# Patient Record
Sex: Female | Born: 1970 | Race: White | Hispanic: No | Marital: Married | Smoking: Never smoker
Health system: Southern US, Community
[De-identification: ages and names within clinical notes are randomized; demographics above are authoritative.]

## PROBLEM LIST (undated history)

## (undated) DIAGNOSIS — I2699 Other pulmonary embolism without acute cor pulmonale: Secondary | ICD-10-CM

## (undated) DIAGNOSIS — O009 Unspecified ectopic pregnancy without intrauterine pregnancy: Secondary | ICD-10-CM

## (undated) DIAGNOSIS — K5792 Diverticulitis of intestine, part unspecified, without perforation or abscess without bleeding: Secondary | ICD-10-CM

## (undated) DIAGNOSIS — O139 Gestational [pregnancy-induced] hypertension without significant proteinuria, unspecified trimester: Secondary | ICD-10-CM

## (undated) HISTORY — PX: MENISCUS REPAIR: SHX5179

---

## 2006-03-10 ENCOUNTER — Encounter: Admission: RE | Admit: 2006-03-10 | Discharge: 2006-03-10 | Payer: Self-pay | Admitting: Orthopedic Surgery

## 2009-08-29 ENCOUNTER — Encounter: Admission: RE | Admit: 2009-08-29 | Discharge: 2009-08-29 | Payer: Self-pay | Admitting: Gastroenterology

## 2016-06-26 ENCOUNTER — Other Ambulatory Visit: Payer: Self-pay | Admitting: Internal Medicine

## 2016-06-26 ENCOUNTER — Ambulatory Visit
Admission: RE | Admit: 2016-06-26 | Discharge: 2016-06-26 | Disposition: A | Discharge status: Home / Self Care | Payer: BLUE CROSS/BLUE SHIELD | Source: Ambulatory Visit | Attending: Internal Medicine | Admitting: Internal Medicine

## 2016-06-26 DIAGNOSIS — M545 Low back pain: Secondary | ICD-10-CM

## 2016-11-12 DIAGNOSIS — Z1231 Encounter for screening mammogram for malignant neoplasm of breast: Secondary | ICD-10-CM | POA: Diagnosis not present

## 2017-02-03 DIAGNOSIS — S5001XA Contusion of right elbow, initial encounter: Secondary | ICD-10-CM | POA: Diagnosis not present

## 2017-06-30 DIAGNOSIS — Z0000 Encounter for general adult medical examination without abnormal findings: Secondary | ICD-10-CM | POA: Diagnosis not present

## 2017-06-30 DIAGNOSIS — M25551 Pain in right hip: Secondary | ICD-10-CM | POA: Diagnosis not present

## 2017-10-31 DIAGNOSIS — Z3049 Encounter for surveillance of other contraceptives: Secondary | ICD-10-CM | POA: Diagnosis not present

## 2017-10-31 DIAGNOSIS — Z0000 Encounter for general adult medical examination without abnormal findings: Secondary | ICD-10-CM | POA: Diagnosis not present

## 2017-11-13 DIAGNOSIS — Z1231 Encounter for screening mammogram for malignant neoplasm of breast: Secondary | ICD-10-CM | POA: Diagnosis not present

## 2017-12-01 DIAGNOSIS — M25511 Pain in right shoulder: Secondary | ICD-10-CM | POA: Diagnosis not present

## 2017-12-03 ENCOUNTER — Other Ambulatory Visit: Payer: Self-pay | Admitting: Sports Medicine

## 2017-12-03 DIAGNOSIS — M25511 Pain in right shoulder: Secondary | ICD-10-CM

## 2017-12-29 DIAGNOSIS — H5711 Ocular pain, right eye: Secondary | ICD-10-CM | POA: Diagnosis not present

## 2017-12-29 DIAGNOSIS — S0500XA Injury of conjunctiva and corneal abrasion without foreign body, unspecified eye, initial encounter: Secondary | ICD-10-CM | POA: Diagnosis not present

## 2018-01-03 DIAGNOSIS — H1033 Unspecified acute conjunctivitis, bilateral: Secondary | ICD-10-CM | POA: Diagnosis not present

## 2018-03-25 ENCOUNTER — Other Ambulatory Visit: Payer: Self-pay

## 2018-03-25 ENCOUNTER — Emergency Department (HOSPITAL_BASED_OUTPATIENT_CLINIC_OR_DEPARTMENT_OTHER): Payer: BLUE CROSS/BLUE SHIELD

## 2018-03-25 ENCOUNTER — Encounter (HOSPITAL_COMMUNITY): Payer: Self-pay

## 2018-03-25 ENCOUNTER — Emergency Department (HOSPITAL_COMMUNITY)
Admission: EM | Admit: 2018-03-25 | Discharge: 2018-03-25 | Disposition: A | Discharge status: Home / Self Care | Payer: BLUE CROSS/BLUE SHIELD | Attending: Emergency Medicine | Admitting: Emergency Medicine

## 2018-03-25 DIAGNOSIS — R609 Edema, unspecified: Secondary | ICD-10-CM

## 2018-03-25 DIAGNOSIS — M79609 Pain in unspecified limb: Secondary | ICD-10-CM | POA: Diagnosis not present

## 2018-03-25 DIAGNOSIS — M79662 Pain in left lower leg: Secondary | ICD-10-CM | POA: Diagnosis not present

## 2018-03-25 HISTORY — DX: Diverticulitis of intestine, part unspecified, without perforation or abscess without bleeding: K57.92

## 2018-03-25 HISTORY — DX: Unspecified ectopic pregnancy without intrauterine pregnancy: O00.90

## 2018-03-25 HISTORY — DX: Gestational (pregnancy-induced) hypertension without significant proteinuria, unspecified trimester: O13.9

## 2018-03-25 NOTE — ED Notes (Signed)
Patient able to ambulate independently  

## 2018-03-25 NOTE — Discharge Instructions (Addendum)
Please read attached information. If you experience any new or worsening signs or symptoms please return to the emergency room for evaluation. Please follow-up with your primary care provider or specialist as discussed.  °

## 2018-03-25 NOTE — Progress Notes (Signed)
*  Preliminary Results* Left lower extremity venous duplex completed. Left lower extremity is negative for deep vein thrombosis. There is no evidence of left Baker's cyst.  03/25/2018 12:15 PM  Billyjack Trompeter Clare Gandyiddle

## 2018-03-25 NOTE — ED Triage Notes (Signed)
Pt endorses left calf pain/swelling x 1 week, [place], sent here for vas US by [facility]. VSS.

## 2018-03-25 NOTE — ED Provider Notes (Signed)
[facility] EMERGENCY DEPARTMENT Provider Note   CSN: [id number] Arrival date & time: 03/25/18  1110   History   Chief Complaint Chief Complaint  Patient presents with  . Leg Swelling    HPI Ahmoni Edge is a 47 y.o. female.  HPI   47 year old female presents stating with complaints of left calf pain. Patient notes that week and half ago she flew from [place] to here. She knows shortly after the flight she developed pain in her left calf. She notes some swelling. She denies any other complaints. No history of the same. No trauma. No chest pain or shortness of breath.  Past Medical History:  Diagnosis Date  . Diverticulitis   . Ectopic pregnancy   . Pregnancy-induced hypertension     There are no active problems to display for this patient.   Past Surgical History:  Procedure Laterality Date  . MENISCUS REPAIR       OB History   None      Home Medications    Prior to Admission medications   Not on File    Family History History reviewed. No pertinent family history.  Social History Social History   Tobacco Use  . Smoking status: Never Smoker  Substance Use Topics  . Alcohol use: Never    Frequency: Never  . Drug use: Never     Allergies   Patient has no known allergies.   Review of Systems Review of Systems  All other systems reviewed and are negative.  Physical Exam Updated Vital Signs BP 112/71   Pulse (!) 54   Temp 98.4 F (36.9 C) (Oral)   Resp 18   Ht 5\' 7"  (1.702 m)   Wt 63.5 kg (140 lb)   LMP 03/13/2018 (Exact Date)   SpO2 100%   BMI 21.93 kg/m   Physical Exam  Constitutional: She is oriented to person, place, and time. She appears well-developed and well-nourished.  HENT:  Head: Normocephalic and atraumatic.  Eyes: Pupils are equal, round, and reactive to light. Conjunctivae are normal. Right eye exhibits no discharge. Left eye exhibits no discharge. No scleral icterus.  Neck: Normal range of  motion. No JVD present. No tracheal deviation present.  Pulmonary/Chest: Effort normal. No stridor.  Musculoskeletal:  Left lower extremity with no swelling or edema, minor tenderness palpation of the left lateral gastroc, full active range of motion- tendon intact  Neurological: She is alert and oriented to person, place, and time. Coordination normal.  Psychiatric: She has a normal mood and affect. Her behavior is normal. Judgment and thought content normal.  Nursing note and vitals reviewed.    ED Treatments / Results  Labs (all labs ordered are listed, but only abnormal results are displayed) Labs Reviewed - No data to display  EKG None  Radiology No results found.  Procedures Procedures (including critical care time)  Medications Ordered in ED Medications - No data to display   Initial Impression / Assessment and Plan / ED Course  I have reviewed the triage vital signs and the nursing notes.  Pertinent labs & imaging results that were available during my care of the patient were reviewed by me and considered in my medical decision making (see chart for details).     Labs:   Imaging:  Consults:  Therapeutics:  Discharge Meds:   Assessment/Plan: 47 year old female presents today with complaints of leg pain. Patient with a negative DVT study here. Likely muscular in nature, discharge with outpatient follow-up. Patient verbalized  understanding and agreement to today's plan had no further questions concerns.      Final Clinical Impressions(s) / ED Diagnoses   Final diagnoses:  Pain of left calf    ED Discharge Orders    None       Rosalio LoudHedges, Makenley Shimp, PA-C 03/25/18 1357    Melene PlanFloyd, Dan, DO 03/25/18 1405

## 2018-07-14 DIAGNOSIS — Z0000 Encounter for general adult medical examination without abnormal findings: Secondary | ICD-10-CM | POA: Diagnosis not present

## 2018-07-14 DIAGNOSIS — Z136 Encounter for screening for cardiovascular disorders: Secondary | ICD-10-CM | POA: Diagnosis not present

## 2018-11-02 DIAGNOSIS — Z0000 Encounter for general adult medical examination without abnormal findings: Secondary | ICD-10-CM | POA: Diagnosis not present

## 2018-11-02 DIAGNOSIS — Z3049 Encounter for surveillance of other contraceptives: Secondary | ICD-10-CM | POA: Diagnosis not present

## 2018-11-04 ENCOUNTER — Other Ambulatory Visit: Payer: Self-pay | Admitting: Internal Medicine

## 2018-11-04 ENCOUNTER — Emergency Department (HOSPITAL_COMMUNITY): Payer: BLUE CROSS/BLUE SHIELD

## 2018-11-04 ENCOUNTER — Other Ambulatory Visit: Payer: Self-pay

## 2018-11-04 ENCOUNTER — Emergency Department (HOSPITAL_COMMUNITY)
Admission: EM | Admit: 2018-11-04 | Discharge: 2018-11-04 | Disposition: A | Discharge status: Home / Self Care | Payer: BLUE CROSS/BLUE SHIELD | Attending: Emergency Medicine | Admitting: Emergency Medicine

## 2018-11-04 ENCOUNTER — Encounter (HOSPITAL_COMMUNITY): Payer: Self-pay | Admitting: *Deleted

## 2018-11-04 ENCOUNTER — Ambulatory Visit
Admission: RE | Admit: 2018-11-04 | Discharge: 2018-11-04 | Disposition: A | Discharge status: Home / Self Care | Payer: BLUE CROSS/BLUE SHIELD | Source: Ambulatory Visit | Attending: Internal Medicine | Admitting: Internal Medicine

## 2018-11-04 DIAGNOSIS — R079 Chest pain, unspecified: Secondary | ICD-10-CM | POA: Diagnosis not present

## 2018-11-04 DIAGNOSIS — R0781 Pleurodynia: Secondary | ICD-10-CM | POA: Diagnosis not present

## 2018-11-04 DIAGNOSIS — R0789 Other chest pain: Secondary | ICD-10-CM | POA: Diagnosis not present

## 2018-11-04 DIAGNOSIS — R0602 Shortness of breath: Secondary | ICD-10-CM | POA: Diagnosis not present

## 2018-11-04 DIAGNOSIS — Z79899 Other long term (current) drug therapy: Secondary | ICD-10-CM | POA: Diagnosis not present

## 2018-11-04 DIAGNOSIS — R7989 Other specified abnormal findings of blood chemistry: Secondary | ICD-10-CM | POA: Diagnosis not present

## 2018-11-04 DIAGNOSIS — M25511 Pain in right shoulder: Secondary | ICD-10-CM | POA: Diagnosis not present

## 2018-11-04 LAB — BASIC METABOLIC PANEL
ANION GAP: 7 (ref 5–15)
BUN: 9 mg/dL (ref 6–20)
CHLORIDE: 107 mmol/L (ref 98–111)
CO2: 23 mmol/L (ref 22–32)
Calcium: 9.1 mg/dL (ref 8.9–10.3)
Creatinine, Ser: 0.9 mg/dL (ref 0.44–1.00)
GFR calc non Af Amer: >60 mL/min (ref >60)
GLUCOSE: 152 mg/dL — AB (ref 70–99)
Potassium: 3.8 mmol/L (ref 3.5–5.1)
Sodium: 137 mmol/L (ref 135–145)

## 2018-11-04 LAB — CBC
HCT: 38.1 % (ref 36.0–46.0)
Hemoglobin: 12.2 g/dL (ref 12.0–15.0)
MCH: 31.1 pg (ref 26.0–34.0)
MCHC: 32 g/dL (ref 30.0–36.0)
MCV: 97.2 fL (ref 80.0–100.0)
NRBC: 0 % (ref 0.0–0.2)
PLATELETS: 290 10*3/uL (ref 150–400)
RBC: 3.92 MIL/uL (ref 3.87–5.11)
RDW: 12 % (ref 11.5–15.5)
WBC: 10.9 10*3/uL — ABNORMAL HIGH (ref 4.0–10.5)

## 2018-11-04 LAB — I-STAT TROPONIN, ED: Troponin i, poc: 0 ng/mL (ref 0.00–0.08)

## 2018-11-04 LAB — I-STAT BETA HCG BLOOD, ED (MC, WL, AP ONLY)

## 2018-11-04 MED ORDER — KETOROLAC TROMETHAMINE 15 MG/ML IJ SOLN
15.0000 mg | Freq: Once | INTRAMUSCULAR | Status: AC
  Administered 2018-11-04: 15 mg via INTRAVENOUS
  Filled 2018-11-04: qty 1

## 2018-11-04 MED ORDER — SODIUM CHLORIDE 0.9% FLUSH
3.0000 mL | Freq: Once | INTRAVENOUS | Status: AC
  Administered 2018-11-04: 3 mL via INTRAVENOUS

## 2018-11-04 MED ORDER — IOPAMIDOL (ISOVUE-370) INJECTION 76%
INTRAVENOUS | Status: AC
  Administered 2018-11-04: 75 mL via INTRAVENOUS
  Filled 2018-11-04: qty 100

## 2018-11-04 NOTE — ED Notes (Signed)
Pt to CT

## 2018-11-04 NOTE — ED Provider Notes (Signed)
[facility] EMERGENCY DEPARTMENT Provider Note   CSN: [id number] Arrival date & time: 11/04/18  1156    History   Chief Complaint Chief Complaint  Patient presents with  . Chest Pain  . Abnormal Lab    HPI Dawn Arnold is a 48 y.o. female.     48 yo F with a chief complaint of right-sided chest wall pain.  This is worse with deep breathing movement or palpation or twisting.  Going on for the past couple days.  She saw her family doctor today in the office and was prescribed muscle relaxer and called back when her d-dimer came back elevated and told to come to the emergency department.  The patient denies hemoptysis denies unilateral lower extremity edema denies recent surgery or immobilization or travel.  She denies estrogen use.  Denies history of PE or DVT.  She denies relation with food denies cough congestion or fever.  This had gotten worse when she lays back flat and so she saw her family doctor today.  The history is provided by the patient.  Chest Pain  Pain location:  R chest Pain quality: sharp and shooting   Pain radiates to:  Does not radiate Pain severity:  Moderate Onset quality:  Gradual Duration:  2 days Timing:  Constant Progression:  Worsening Chronicity:  New Context: breathing   Relieved by:  Nothing Worsened by:  Nothing Ineffective treatments:  None tried Associated symptoms: no dizziness, no fever, no headache, no nausea, no palpitations, no shortness of breath and no vomiting   Abnormal Lab    Past Medical History:  Diagnosis Date  . Diverticulitis   . Ectopic pregnancy   . Pregnancy-induced hypertension     There are no active problems to display for this patient.   Past Surgical History:  Procedure Laterality Date  . MENISCUS REPAIR       OB History   No obstetric history on file.      Home Medications    Prior to Admission medications   Medication Sig Start Date End Date Taking? Authorizing Provider    etonogestrel-ethinyl estradiol (NUVARING) 0.12-0.015 MG/24HR vaginal ring Take 0.12 mg by mouth once. 11/02/18  Yes [provider]  ibuprofen (ADVIL) 200 MG tablet Take 200 mg by mouth every 6 (six) hours as needed.   Yes [provider]  polycarbophil (FIBERCON) 625 MG tablet Take 625 mg by mouth daily. gummie   Yes [provider]    Family History History reviewed. No pertinent family history.  Social History Social History   Tobacco Use  . Smoking status: Never Smoker  Substance Use Topics  . Alcohol use: Never    Frequency: Never  . Drug use: Never     Allergies   Patient has no known allergies.   Review of Systems Review of Systems  Constitutional: Negative for chills and fever.  HENT: Negative for congestion and rhinorrhea.   Eyes: Negative for redness and visual disturbance.  Respiratory: Negative for shortness of breath and wheezing.   Cardiovascular: Positive for chest pain. Negative for palpitations.  Gastrointestinal: Negative for nausea and vomiting.  Genitourinary: Negative for dysuria and urgency.  Musculoskeletal: Positive for arthralgias and myalgias.  Skin: Negative for pallor and wound.  Neurological: Negative for dizziness and headaches.     Physical Exam Updated Vital Signs BP 112/77   Pulse 66   Temp 98.2 F (36.8 C) (Oral)   Resp 17   Ht 5\' 7"  (1.702 m)  Wt 65.8 kg   LMP 10/22/2018 (Exact Date)   SpO2 100%   BMI 22.71 kg/m   Physical Exam Vitals signs and nursing note reviewed.  Constitutional:      General: She is not in acute distress.    Appearance: She is well-developed. She is not diaphoretic.  HENT:     Head: Normocephalic and atraumatic.  Eyes:     Pupils: Pupils are equal, round, and reactive to light.  Neck:     Musculoskeletal: Normal range of motion and neck supple.  Cardiovascular:     Rate and Rhythm: Normal rate and regular rhythm.     Heart sounds: No murmur. No friction rub. No  gallop.   Pulmonary:     Effort: Pulmonary effort is normal.     Breath sounds: No wheezing or rales.  Chest:     Comments: No appreciable pain on palpation but the patient is able to reproduce her times by twisting her torso. Abdominal:     General: There is no distension.     Palpations: Abdomen is soft.     Tenderness: There is no abdominal tenderness.     Comments: Benign abdominal exam.  Negative Murphy's, no pain in the right upper quadrant  Musculoskeletal:        General: No tenderness.  Skin:    General: Skin is warm and dry.  Neurological:     Mental Status: She is alert and oriented to person, place, and time.  Psychiatric:        Behavior: Behavior normal.      ED Treatments / Results  Labs (all labs ordered are listed, but only abnormal results are displayed) Labs Reviewed  BASIC METABOLIC PANEL - Abnormal; Notable for the following components:      Result Value   Glucose, Bld 152 (*)    All other components within normal limits  CBC - Abnormal; Notable for the following components:   WBC 10.9 (*)    All other components within normal limits  I-STAT TROPONIN, ED  I-STAT BETA HCG BLOOD, ED ([facility], [facility], [facility] ONLY)    EKG EKG Interpretation  Date/Time:  Wednesday November 04 2018 12:01:20 EST Ventricular Rate:  85 PR Interval:  130 QRS Duration: 84 QT Interval:  356 QTC Calculation: 423 R Axis:   84 Text Interpretation:  Normal sinus rhythm with sinus arrhythmia Normal ECG No significant change since last tracing Confirmed by Melene PlanFloyd, Hillel Card [phone]) on 11/04/2018 12:26:06 PM Also confirmed by Melene PlanFloyd, Macaila Tahir [phone]), editor Barbette Hairassel, Kerry [phone]([id number])  on 11/04/2018 12:48:14 PM   Radiology Dg Chest 2 View  Result Date: 11/04/2018 CLINICAL DATA:  Chest pain. EXAM: CHEST - 2 VIEW COMPARISON:  None. FINDINGS: The heart size and mediastinal contours are within normal limits. Both lungs are clear. No pneumothorax or pleural effusion is noted. The visualized skeletal structures are  unremarkable. IMPRESSION: No active cardiopulmonary disease. Electronically Signed   By: Lupita RaiderJames  Green Jr, M.D.   On: 11/04/2018 10:25   Ct Angio Chest Pe W And/or Wo Contrast  Result Date: 11/04/2018 CLINICAL DATA:  Pt c/o severe pain that started in RIGHT shoulder, and has moved down entire RIGHT side of chest; Pt extremely SOB when lying down; onset of symptoms was yesterday, no known injury the EXAM: CT ANGIOGRAPHY CHEST WITH CONTRAST TECHNIQUE: Multidetector CT imaging of the chest was performed using the standard protocol during bolus administration of intravenous contrast. Multiplanar CT image reconstructions and MIPs were obtained to evaluate the vascular anatomy.  CONTRAST:  ISOVUE-370 IOPAMIDOL (ISOVUE-370) INJECTION 76% COMPARISON:  10/22/2015 FINDINGS: Cardiovascular: Heart size normal. No effusion. Satisfactory opacification of pulmonary arteries noted, and there is no evidence of pulmonary emboli. Mediastinum/Nodes: Fair contrast opacification of the thoracic aorta. No suggestion of dissection, aneurysm, or stenosis. 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 Visualized proximal abdominal aorta unremarkable. No significant atheromatous plaque. Lungs/Pleura: Trace right pleural effusion. No pneumothorax. Focal subsegmental atelectasis or consolidation, lateral basal segment right lower lobe. Lungs otherwise clear. Upper Abdomen: No acute findings Musculoskeletal: No chest wall abnormality. No acute or significant osseous findings. Review of the MIP images confirms the above findings. IMPRESSION: 1.  Negative for acute PE or thoracic aortic dissection. 2. Subsegmental airspace disease, lateral basal segment right lower lobe, with small effusion. Electronically Signed   By: Corlis Leak M.D.   On: 11/04/2018 14:14    Procedures Procedures (including critical care time)  Medications Ordered in ED Medications  sodium chloride flush (NS) 0.9 % injection 3 mL  (3 mLs Intravenous Given 11/04/18 1245)  iopamidol (ISOVUE-370) 76 % injection (75 mLs Intravenous Contrast Given 11/04/18 1405)     Initial Impression / Assessment and Plan / ED Course  I have reviewed the triage vital signs and the nursing notes.  Pertinent labs & imaging results that were available during my care of the patient were reviewed by me and considered in my medical decision making (see chart for details).        48 yo F with a chief complaint of pleuritic right-sided chest pain.  This been going on for the past couple days.  Sounds muscular by history.  She had a d-dimer performed in the office that was negative.  No noted risk factors for me for PE.  She is PERC negative.  ECG here is unremarkable troponin is negative.  CT scan of the chest was negative for acute pulmonary embolism or pneumonia or other concerning pathology.  I discussed the results with the patient.  Will treat as its muscular.  PCP follow-up.  2:30 PM:  I have discussed the diagnosis/risks/treatment options with the patient and family and believe the pt to be eligible for discharge home to follow-up with PCP. We also discussed returning to the ED immediately if new or worsening sx occur. We discussed the sx which are most concerning (e.g., sudden worsening pain, fever, inability to tolerate by mouth) that necessitate immediate return. Medications administered to the patient during their visit and any new prescriptions provided to the patient are listed below.  Medications given during this visit Medications  sodium chloride flush (NS) 0.9 % injection 3 mL (3 mLs Intravenous Given 11/04/18 1245)  iopamidol (ISOVUE-370) 76 % injection (75 mLs Intravenous Contrast Given 11/04/18 1405)     The patient appears reasonably screen and/or stabilized for discharge and I doubt any other medical condition or other [facility] requiring further screening, evaluation, or treatment in the ED at this time prior to discharge.     Final Clinical Impressions(s) / ED Diagnoses   Final diagnoses:  Chest wall pain    ED Discharge Orders    None       Melene Plan, DO 11/04/18 1431

## 2018-11-04 NOTE — Discharge Instructions (Signed)
Take 4 over the counter ibuprofen tablets 3 times a day or 2 over-the-counter naproxen tablets twice a day for pain. Also take tylenol 1000mg(2 extra strength) four times a day.    

## 2018-11-04 NOTE — ED Triage Notes (Signed)
Pt sent over due to elevated d-dimer by her PCP office, they checked this due to chest pain since Monday, pt reports pain is worse with taking a deep breath or laying flat, no distress noted

## 2018-11-16 DIAGNOSIS — Z1231 Encounter for screening mammogram for malignant neoplasm of breast: Secondary | ICD-10-CM | POA: Diagnosis not present

## 2018-12-25 DIAGNOSIS — R51 Headache: Secondary | ICD-10-CM | POA: Diagnosis not present

## 2018-12-29 DIAGNOSIS — G44009 Cluster headache syndrome, unspecified, not intractable: Secondary | ICD-10-CM | POA: Diagnosis not present

## 2019-10-13 DIAGNOSIS — Z20828 Contact with and (suspected) exposure to other viral communicable diseases: Secondary | ICD-10-CM | POA: Diagnosis not present

## 2019-10-16 DIAGNOSIS — Z20828 Contact with and (suspected) exposure to other viral communicable diseases: Secondary | ICD-10-CM | POA: Diagnosis not present

## 2019-10-20 DIAGNOSIS — Z20828 Contact with and (suspected) exposure to other viral communicable diseases: Secondary | ICD-10-CM | POA: Diagnosis not present

## 2019-10-22 DIAGNOSIS — Z20828 Contact with and (suspected) exposure to other viral communicable diseases: Secondary | ICD-10-CM | POA: Diagnosis not present

## 2019-10-26 DIAGNOSIS — U071 COVID-19: Secondary | ICD-10-CM | POA: Diagnosis not present

## 2019-11-11 DIAGNOSIS — D225 Melanocytic nevi of trunk: Secondary | ICD-10-CM | POA: Diagnosis not present

## 2019-11-11 DIAGNOSIS — D1801 Hemangioma of skin and subcutaneous tissue: Secondary | ICD-10-CM | POA: Diagnosis not present

## 2019-11-11 DIAGNOSIS — I781 Nevus, non-neoplastic: Secondary | ICD-10-CM | POA: Diagnosis not present

## 2019-11-11 DIAGNOSIS — D2239 Melanocytic nevi of other parts of face: Secondary | ICD-10-CM | POA: Diagnosis not present

## 2019-11-12 DIAGNOSIS — R0602 Shortness of breath: Secondary | ICD-10-CM | POA: Diagnosis not present

## 2019-11-12 DIAGNOSIS — U071 COVID-19: Secondary | ICD-10-CM | POA: Diagnosis not present

## 2019-11-12 DIAGNOSIS — R000 Tachycardia, unspecified: Secondary | ICD-10-CM | POA: Diagnosis not present

## 2019-11-17 DIAGNOSIS — Z3049 Encounter for surveillance of other contraceptives: Secondary | ICD-10-CM | POA: Diagnosis not present

## 2019-11-17 DIAGNOSIS — Z1231 Encounter for screening mammogram for malignant neoplasm of breast: Secondary | ICD-10-CM | POA: Diagnosis not present

## 2019-11-17 DIAGNOSIS — Z01419 Encounter for gynecological examination (general) (routine) without abnormal findings: Secondary | ICD-10-CM | POA: Diagnosis not present

## 2019-12-02 DIAGNOSIS — R928 Other abnormal and inconclusive findings on diagnostic imaging of breast: Secondary | ICD-10-CM | POA: Diagnosis not present

## 2019-12-02 DIAGNOSIS — N6321 Unspecified lump in the left breast, upper outer quadrant: Secondary | ICD-10-CM | POA: Diagnosis not present

## 2019-12-10 DIAGNOSIS — D529 Folate deficiency anemia, unspecified: Secondary | ICD-10-CM | POA: Diagnosis not present

## 2020-08-05 DIAGNOSIS — Z20828 Contact with and (suspected) exposure to other viral communicable diseases: Secondary | ICD-10-CM | POA: Diagnosis not present

## 2020-08-05 DIAGNOSIS — J029 Acute pharyngitis, unspecified: Secondary | ICD-10-CM | POA: Diagnosis not present

## 2020-08-05 DIAGNOSIS — J069 Acute upper respiratory infection, unspecified: Secondary | ICD-10-CM | POA: Diagnosis not present

## 2020-10-07 DIAGNOSIS — Z20822 Contact with and (suspected) exposure to covid-19: Secondary | ICD-10-CM | POA: Diagnosis not present

## 2020-10-08 DIAGNOSIS — Z20828 Contact with and (suspected) exposure to other viral communicable diseases: Secondary | ICD-10-CM | POA: Diagnosis not present

## 2020-10-08 DIAGNOSIS — R509 Fever, unspecified: Secondary | ICD-10-CM | POA: Diagnosis not present

## 2020-10-08 DIAGNOSIS — J209 Acute bronchitis, unspecified: Secondary | ICD-10-CM | POA: Diagnosis not present

## 2020-10-08 DIAGNOSIS — J029 Acute pharyngitis, unspecified: Secondary | ICD-10-CM | POA: Diagnosis not present

## 2020-10-12 DIAGNOSIS — R0981 Nasal congestion: Secondary | ICD-10-CM | POA: Diagnosis not present

## 2021-02-01 IMAGING — DX DG CHEST 2V
2 series · 2 of 2 positions shown · non-contrast
Comparison: None.

CLINICAL DATA: Chest pain.

EXAM:
CHEST - 2 VIEW

[dg chest 2 view (1 of 2)]
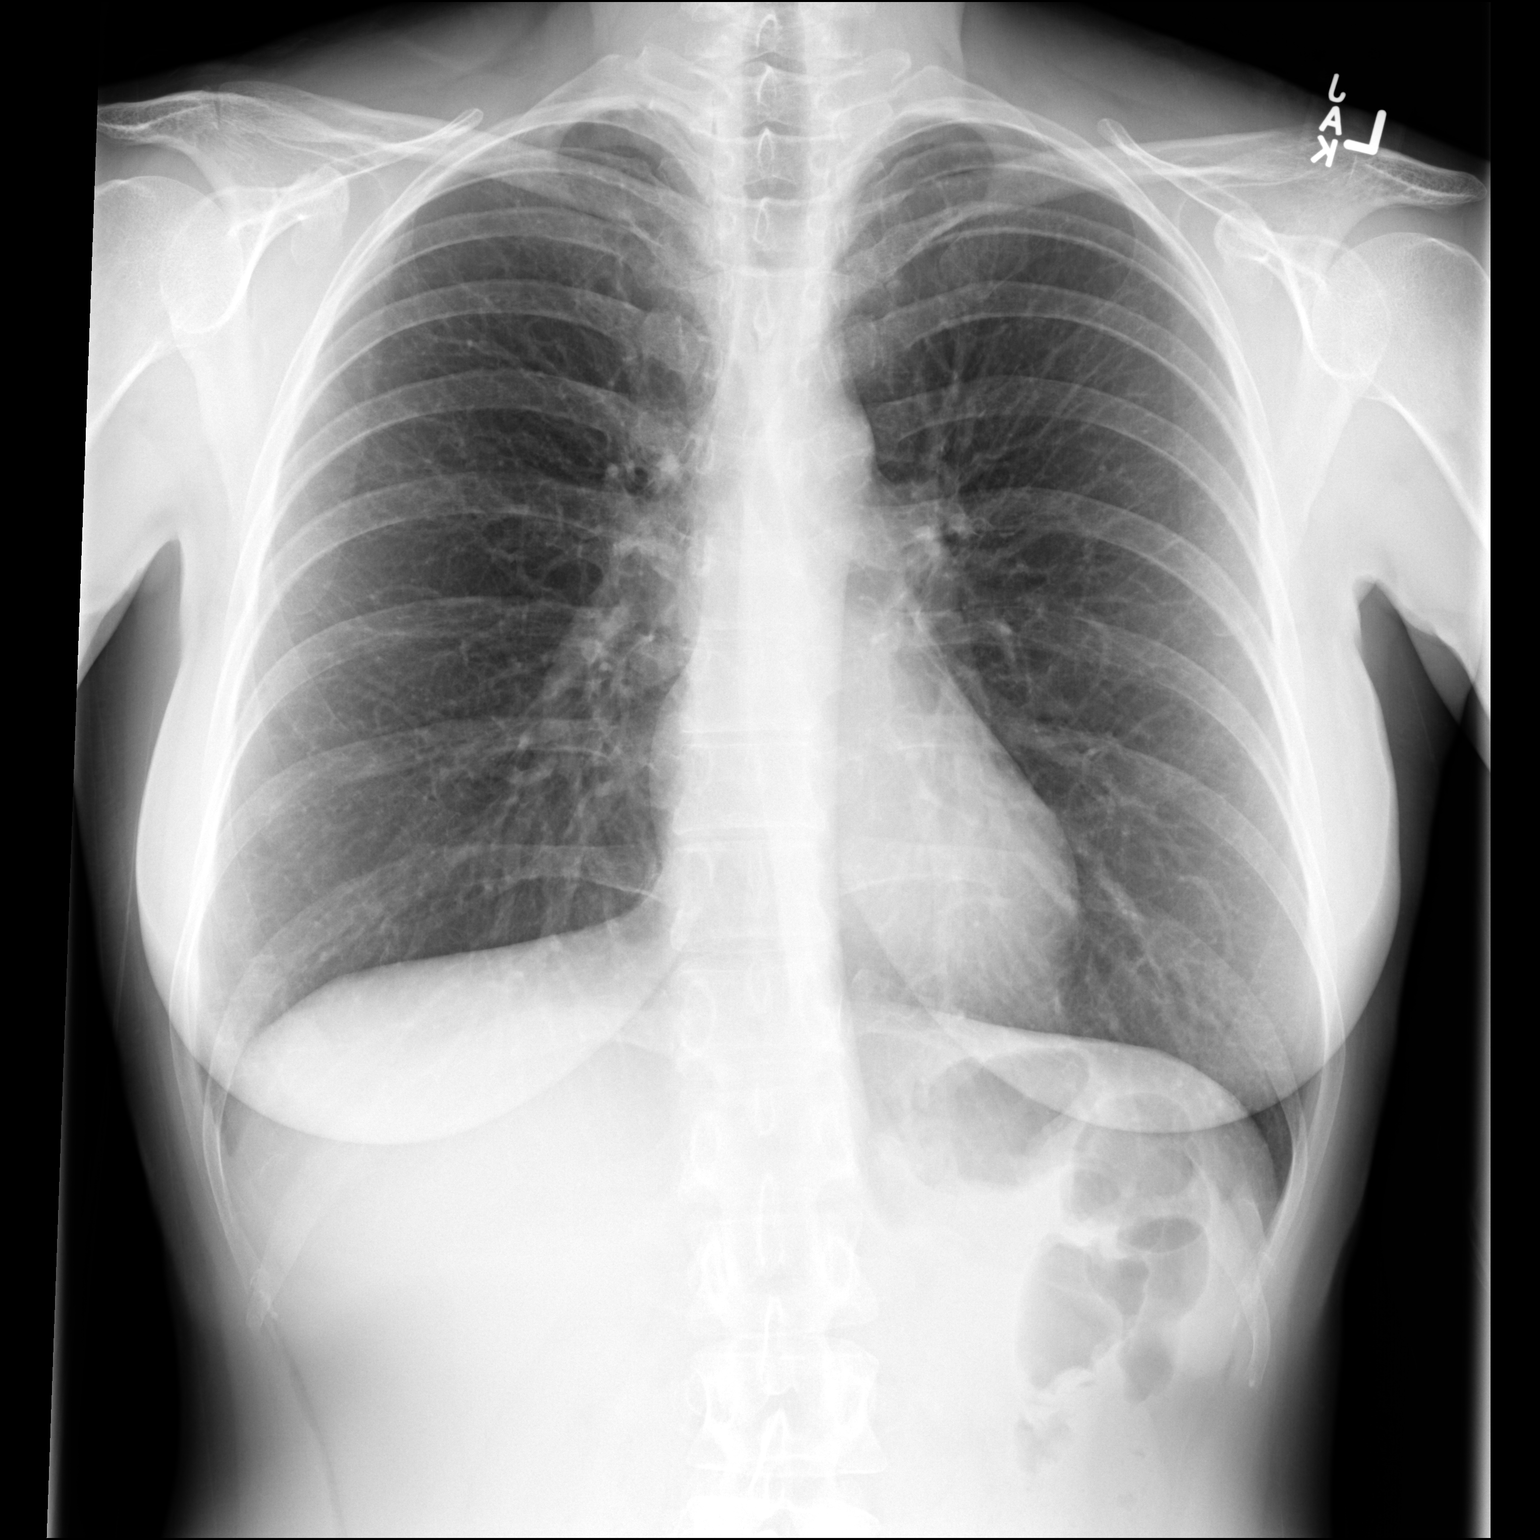

[dg chest 2 view (2 of 2)]
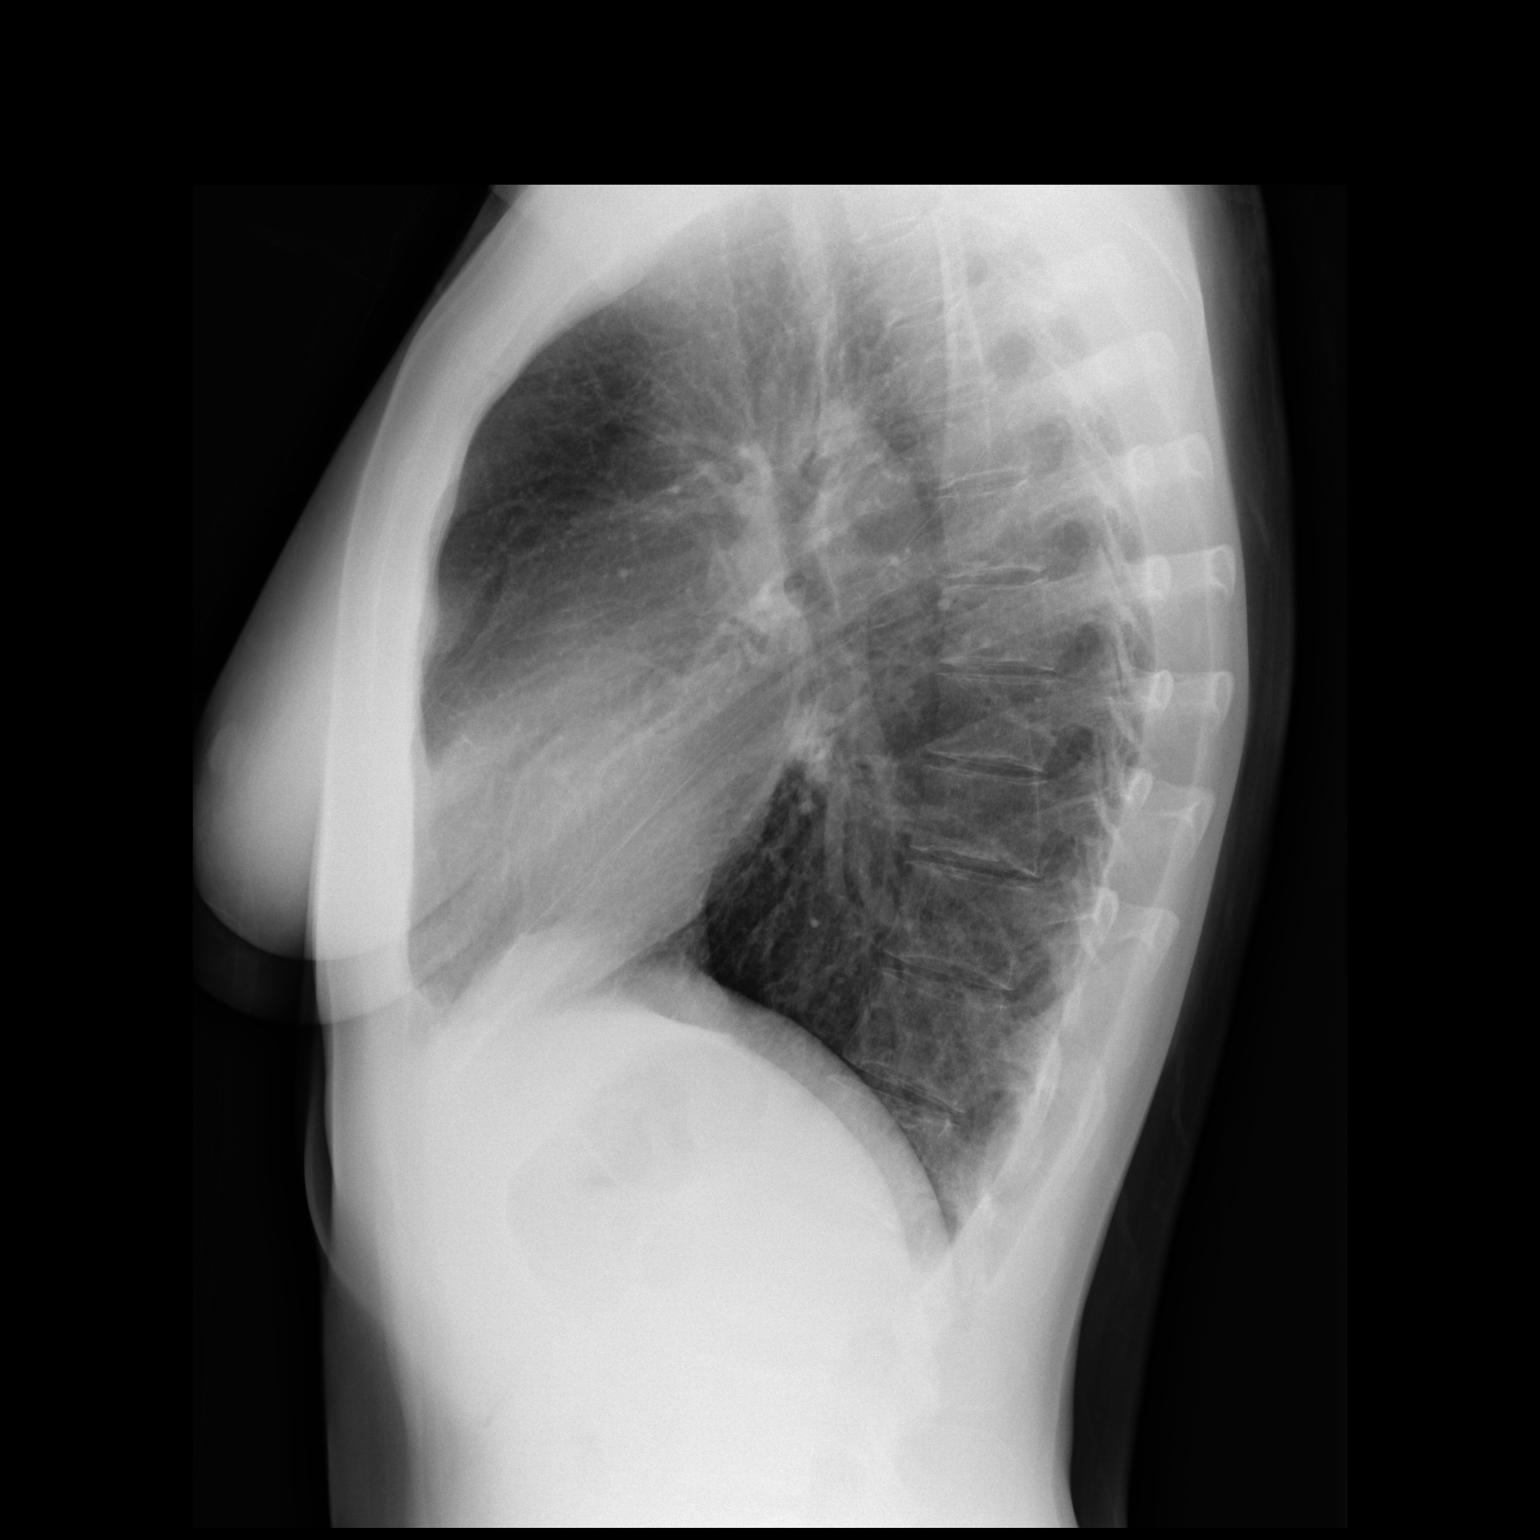

[2 of 2 positions shown; findings below may reference images not displayed]

FINDINGS: The heart size and mediastinal contours are within normal limits.
Both lungs are clear. No pneumothorax or pleural effusion is noted.
The visualized skeletal structures are unremarkable.
IMPRESSION: No active cardiopulmonary disease.

## 2021-02-14 DIAGNOSIS — Z1151 Encounter for screening for human papillomavirus (HPV): Secondary | ICD-10-CM | POA: Diagnosis not present

## 2021-02-14 DIAGNOSIS — Z1231 Encounter for screening mammogram for malignant neoplasm of breast: Secondary | ICD-10-CM | POA: Diagnosis not present

## 2021-02-14 DIAGNOSIS — Z01411 Encounter for gynecological examination (general) (routine) with abnormal findings: Secondary | ICD-10-CM | POA: Diagnosis not present

## 2021-02-14 DIAGNOSIS — Z01419 Encounter for gynecological examination (general) (routine) without abnormal findings: Secondary | ICD-10-CM | POA: Diagnosis not present

## 2021-02-14 DIAGNOSIS — N952 Postmenopausal atrophic vaginitis: Secondary | ICD-10-CM | POA: Diagnosis not present

## 2021-02-14 DIAGNOSIS — Z8759 Personal history of other complications of pregnancy, childbirth and the puerperium: Secondary | ICD-10-CM | POA: Diagnosis not present

## 2021-08-28 ENCOUNTER — Emergency Department (HOSPITAL_COMMUNITY)
Admission: EM | Admit: 2021-08-28 | Discharge: 2021-08-28 | Disposition: A | Discharge status: Home / Self Care | Payer: BC Managed Care – PPO | Attending: Emergency Medicine | Admitting: Emergency Medicine

## 2021-08-28 ENCOUNTER — Emergency Department (HOSPITAL_COMMUNITY): Payer: BC Managed Care – PPO

## 2021-08-28 DIAGNOSIS — W19XXXA Unspecified fall, initial encounter: Secondary | ICD-10-CM

## 2021-08-28 DIAGNOSIS — Y9301 Activity, walking, marching and hiking: Secondary | ICD-10-CM | POA: Diagnosis not present

## 2021-08-28 DIAGNOSIS — S3993XA Unspecified injury of pelvis, initial encounter: Secondary | ICD-10-CM | POA: Diagnosis not present

## 2021-08-28 DIAGNOSIS — Y92009 Unspecified place in unspecified non-institutional (private) residence as the place of occurrence of the external cause: Secondary | ICD-10-CM | POA: Insufficient documentation

## 2021-08-28 DIAGNOSIS — W109XXA Fall (on) (from) unspecified stairs and steps, initial encounter: Secondary | ICD-10-CM | POA: Insufficient documentation

## 2021-08-28 DIAGNOSIS — S300XXA Contusion of lower back and pelvis, initial encounter: Secondary | ICD-10-CM | POA: Insufficient documentation

## 2021-08-28 NOTE — Discharge Instructions (Signed)
There is no indication for fracture at this time.  Use ice on sore spots 3 times a day for 2 days after that heat.  For pain take ibuprofen or acetaminophen.  To increase your activity as tolerated.  It may take 3 to 5 days to resume your normal exercise pattern.  Follow-up with your doctor for problems.

## 2021-08-28 NOTE — ED Provider Notes (Signed)
[facility] EMERGENCY DEPARTMENT Provider Note   CSN: [id number] Arrival date & time: 08/28/21  0865     History Chief Complaint  Patient presents with   Fall   Hip Pain    Dawn Arnold is a 50 y.o. female.  HPI She presents for evaluation of fall which occurred at 530 at home.  She was able ambulate afterwards.  She describes the fall as misstepped while walking down stairs carrying things, and falling onto her buttocks.  She was able to ambulate after a few minutes.  She went back to bed and waited for her husband to bring her here.  She came by private vehicle.  She complains of pain in the left buttock region.  She denies other injuries.    Past Medical History:  Diagnosis Date   Diverticulitis    Ectopic pregnancy    Pregnancy-induced hypertension     There are no problems to display for this patient.   Past Surgical History:  Procedure Laterality Date   MENISCUS REPAIR       OB History   No obstetric history on file.     No family history on file.  Social History   Tobacco Use   Smoking status: Never  Substance Use Topics   Alcohol use: Never   Drug use: Never    Home Medications Prior to Admission medications   Medication Sig Start Date End Date Taking? Authorizing Provider  etonogestrel-ethinyl estradiol (NUVARING) 0.12-0.015 MG/24HR vaginal ring Take 0.12 mg by mouth once. 11/02/18   [provider]  ibuprofen (ADVIL) 200 MG tablet Take 200 mg by mouth every 6 (six) hours as needed.    [provider]  polycarbophil (FIBERCON) 625 MG tablet Take 625 mg by mouth daily. gummie    [provider]    Allergies    Patient has no known allergies.  Review of Systems   Review of Systems  All other systems reviewed and are negative.  Physical Exam Updated Vital Signs BP 140/82    Pulse 79    Temp 99.1 F (37.3 C) (Oral)    Resp 16    LMP 08/16/2021    SpO2 100%   Physical Exam Vitals and nursing  note reviewed.  Constitutional:      Appearance: She is well-developed. She is not ill-appearing.  HENT:     Head: Normocephalic and atraumatic.     Right Ear: External ear normal.     Left Ear: External ear normal.  Eyes:     Conjunctiva/sclera: Conjunctivae normal.     Pupils: Pupils are equal, round, and reactive to light.  Neck:     Trachea: Phonation normal.  Cardiovascular:     Rate and Rhythm: Normal rate.  Pulmonary:     Effort: Pulmonary effort is normal. No respiratory distress.     Breath sounds: No stridor.  Abdominal:     General: There is no distension.  Musculoskeletal:        General: Normal range of motion.     Cervical back: Normal range of motion and neck supple.     Comments: Mild tenderness, without swelling in the left mid lateral upper buttock.  No palpable tenderness of the lumbar or sacral areas.  Normal range of motion of left hip and knee.  Skin:    General: Skin is warm and dry.  Neurological:     Mental Status: She is alert and oriented to person, place, and time.  Cranial Nerves: No cranial nerve deficit.     Sensory: No sensory deficit.     Motor: No abnormal muscle tone.     Coordination: Coordination normal.  Psychiatric:        Mood and Affect: Mood normal.        Behavior: Behavior normal.        Thought Content: Thought content normal.        Judgment: Judgment normal.    ED Results / Procedures / Treatments   Labs (all labs ordered are listed, but only abnormal results are displayed) Labs Reviewed - No data to display  EKG None  Radiology DG Sacrum/Coccyx  Result Date: 08/28/2021 CLINICAL DATA:  Larey Seat with pain in the sacrum and coccyx. EXAM: SACRUM AND COCCYX - 2+ VIEW COMPARISON:  None. FINDINGS: There is no evidence of fracture or other focal bone lesions. IMPRESSION: Normal radiographs. Electronically Signed   By: Paulina Fusi M.D.   On: 08/28/2021 08:07    Procedures Procedures   Medications Ordered in ED Medications  - No data to display  ED Course  I have reviewed the triage vital signs and the nursing notes.  Pertinent labs & imaging results that were available during my care of the patient were reviewed by me and considered in my medical decision making (see chart for details).    MDM Rules/Calculators/A&P                            Patient Vitals for the past 24 hrs:  BP Temp Temp src Pulse Resp SpO2  08/28/21 0945 140/82 -- -- 79 16 100 %  08/28/21 0915 114/80 -- -- (!) 55 (!) 26 100 %  08/28/21 0900 113/78 -- -- (!) 56 18 100 %  08/28/21 0734 128/72 99.1 F (37.3 C) Oral 75 16 100 %    9:51 AM Reevaluation with update and discussion. After initial assessment and treatment, an updated evaluation reveals no further complaints, findings discussed and questions answered. Mancel Bale   Medical Decision Making:  This patient is presenting for evaluation of contusion left buttock, which does require a range of treatment options, and is a complaint that involves a moderate risk of morbidity and mortality. The differential diagnoses include contusion versus fracture. I decided to review old records, and in summary presenting with injuries from a mechanical fall.  I did not require additional historical information from anyone.   Radiologic Tests Ordered, included sacrum and coccyx x-rays.  I independently Visualized: Radiographic images, which show no fracture    Critical Interventions-clinical evaluation, radiography  After These Interventions, the Patient was reevaluated and was found stable for discharge.  Patient with contusion, no fracture, no reason for further ED evaluation or hospitalization at this time  CRITICAL CARE-no Performed by: Mancel Bale  Nursing Notes Reviewed/ Care Coordinated Applicable Imaging Reviewed Interpretation of Laboratory Data incorporated into ED treatment  The patient appears reasonably screened and/or stabilized for discharge and I doubt any other  medical condition or other [facility] requiring further screening, evaluation, or treatment in the ED at this time prior to discharge.  Plan: Home Medications-continue usual and use OTC meds for pain; Home Treatments-cryotherapy, heat therapy; return here if the recommended treatment, does not improve the symptoms; Recommended follow up-PCP,     Final Clinical Impression(s) / ED Diagnoses Final diagnoses:  Fall  Fall, initial encounter  Contusion of pelvis, initial encounter    Rx / DC Orders  ED Discharge Orders     None        Mancel Bale, MD 08/28/21 [phone]

## 2021-08-28 NOTE — ED Triage Notes (Signed)
Pt. Stated, she slipped down stairs and hit her tail bone and left side , left hip area.

## 2023-01-10 ENCOUNTER — Encounter (HOSPITAL_BASED_OUTPATIENT_CLINIC_OR_DEPARTMENT_OTHER): Payer: Self-pay | Admitting: Urology

## 2023-01-10 ENCOUNTER — Emergency Department (HOSPITAL_BASED_OUTPATIENT_CLINIC_OR_DEPARTMENT_OTHER): Payer: BC Managed Care – PPO

## 2023-01-10 ENCOUNTER — Emergency Department (HOSPITAL_BASED_OUTPATIENT_CLINIC_OR_DEPARTMENT_OTHER)
Admission: EM | Admit: 2023-01-10 | Discharge: 2023-01-10 | Disposition: A | Discharge status: Home / Self Care | Payer: BC Managed Care – PPO | Attending: Emergency Medicine | Admitting: Emergency Medicine

## 2023-01-10 ENCOUNTER — Other Ambulatory Visit: Payer: Self-pay

## 2023-01-10 DIAGNOSIS — I2694 Multiple subsegmental pulmonary emboli without acute cor pulmonale: Secondary | ICD-10-CM | POA: Diagnosis not present

## 2023-01-10 DIAGNOSIS — I10 Essential (primary) hypertension: Secondary | ICD-10-CM | POA: Diagnosis not present

## 2023-01-10 DIAGNOSIS — R0789 Other chest pain: Secondary | ICD-10-CM | POA: Diagnosis present

## 2023-01-10 LAB — CBC
HCT: 37.3 % (ref 36.0–46.0)
Hemoglobin: 12.3 g/dL (ref 12.0–15.0)
MCH: 31.5 pg (ref 26.0–34.0)
MCHC: 33 g/dL (ref 30.0–36.0)
MCV: 95.6 fL (ref 80.0–100.0)
Platelets: 273 10*3/uL (ref 150–400)
RBC: 3.9 MIL/uL (ref 3.87–5.11)
RDW: 12.4 % (ref 11.5–15.5)
WBC: 11.3 10*3/uL — ABNORMAL HIGH (ref 4.0–10.5)
nRBC: 0 % (ref 0.0–0.2)

## 2023-01-10 LAB — BASIC METABOLIC PANEL
Anion gap: 7 (ref 5–15)
BUN: 24 mg/dL — ABNORMAL HIGH (ref 6–20)
CO2: 24 mmol/L (ref 22–32)
Calcium: 8.7 mg/dL — ABNORMAL LOW (ref 8.9–10.3)
Chloride: 104 mmol/L (ref 98–111)
Creatinine, Ser: 0.99 mg/dL (ref 0.44–1.00)
GFR, Estimated: >60 mL/min (ref >60)
Glucose, Bld: 105 mg/dL — ABNORMAL HIGH (ref 70–99)
Potassium: 4.4 mmol/L (ref 3.5–5.1)
Sodium: 135 mmol/L (ref 135–145)

## 2023-01-10 LAB — PREGNANCY, URINE: Preg Test, Ur: NEGATIVE

## 2023-01-10 LAB — TROPONIN I (HIGH SENSITIVITY)
Troponin I (High Sensitivity): 5 ng/L (ref <18)
Troponin I (High Sensitivity): 5 ng/L (ref <18)

## 2023-01-10 LAB — D-DIMER, QUANTITATIVE: D-Dimer, Quant: 2.76 ug/mL-FEU — ABNORMAL HIGH (ref 0.00–0.50)

## 2023-01-10 MED ORDER — APIXABAN 2.5 MG PO TABS
10.0000 mg | ORAL_TABLET | Freq: Two times a day (BID) | ORAL | Status: DC
  Administered 2023-01-10: 10 mg via ORAL
  Filled 2023-01-10: qty 4

## 2023-01-10 MED ORDER — ACETAMINOPHEN 325 MG PO TABS
1000.0000 mg | ORAL_TABLET | Freq: Three times a day (TID) | ORAL | 0 refills | Status: AC | PRN
Start: 2023-01-10 — End: ?

## 2023-01-10 MED ORDER — APIXABAN (ELIQUIS) VTE STARTER PACK (10MG AND 5MG): ORAL_TABLET | ORAL | 0 refills | Status: DC

## 2023-01-10 MED ORDER — IOHEXOL 350 MG/ML SOLN
75.0000 mL | Freq: Once | INTRAVENOUS | Status: AC | PRN
  Administered 2023-01-10: 75 mL via INTRAVENOUS

## 2023-01-10 NOTE — ED Notes (Signed)
Attempt at IV x 1 but unable to pass valve, some labs obtained, light green and lavender

## 2023-01-10 NOTE — ED Triage Notes (Signed)
Pt states has sharp pain in left side of lung/chest approx 2 hours after working out  Took ibuprofen for pain Coughed up a little blood, took xray at [facility] and saw something on left lobe and was told to come here  Received "Shot for pain" and it is helping  Denies any SOB

## 2023-01-10 NOTE — ED Provider Notes (Signed)
[facility] Provider Note   CSN: [id number] Arrival date & time: 01/10/23  1613     History {Add pertinent medical, surgical, social history, OB history to HPI:1} Chief Complaint  Patient presents with   Chest Pain    Dawn Arnold is a 52 y.o. female presented to the ED with acute onset of left-sided chest pain and bloody cough.  Patient reports that she had a routine workout this morning, was not having any pain at that time.  Approximately 2 hours later she had an acute onset of sharp pain in the left side of her chest, mostly localized around the left lower ribs.  She said the pain seems to be worse with laying down and better with standing upright.  She had the pain seemed to radiate into her left neck.  She took ibuprofen which significantly helped her pain.  She went to urgent care, where they gave her some Toradol, now her pain is extremely minimal.  But they were concerned because the patient reported that she had some coughing earlier and noted blood in her sputum.  She denies any ongoing issues with coughing the past several days, or fevers or chills.  Denies smoking history.  Denies any unilateral leg or calf pain or swelling, or history of DVT or PE, or any recent surgeries.  She does report that she has a NuvaRing  HPI     Home Medications Prior to Admission medications   Medication Sig Start Date End Date Taking? Authorizing Provider  etonogestrel-ethinyl estradiol (NUVARING) 0.12-0.015 MG/24HR vaginal ring Take 0.12 mg by mouth once. 11/02/18   [provider]  ibuprofen (ADVIL) 200 MG tablet Take 200 mg by mouth every 6 (six) hours as needed.    [provider]  polycarbophil (FIBERCON) 625 MG tablet Take 625 mg by mouth daily. gummie    [provider]      Allergies    Patient has no known allergies.    Review of Systems   Review of Systems  Physical Exam Updated Vital Signs BP (!) 161/106  (BP Location: Left Arm)   Pulse 88   Temp 98 F (36.7 C) (Oral)   Resp 18   Ht 5\' 7"  (1.702 m)   Wt 65.8 kg   LMP 12/16/2022   SpO2 99%   BMI 22.72 kg/m  Physical Exam Constitutional:      General: She is not in acute distress. HENT:     Head: Normocephalic and atraumatic.  Eyes:     Conjunctiva/sclera: Conjunctivae normal.     Pupils: Pupils are equal, round, and reactive to light.  Cardiovascular:     Rate and Rhythm: Normal rate and regular rhythm.  Pulmonary:     Effort: Pulmonary effort is normal. No respiratory distress.  Abdominal:     General: There is no distension.     Tenderness: There is no abdominal tenderness.  Skin:    General: Skin is warm and dry.  Neurological:     General: No focal deficit present.     Mental Status: She is alert. Mental status is at baseline.  Psychiatric:        Mood and Affect: Mood normal.        Behavior: Behavior normal.     ED Results / Procedures / Treatments   Labs (all labs ordered are listed, but only abnormal results are displayed) Labs Reviewed  BASIC METABOLIC PANEL  CBC  PREGNANCY, URINE  D-DIMER,  QUANTITATIVE  TROPONIN I (HIGH SENSITIVITY)    EKG None  Radiology No results found.  Procedures Procedures  {Document cardiac monitor, telemetry assessment procedure when appropriate:1}  Medications Ordered in ED Medications - No data to display  ED Course/ Medical Decision Making/ A&P   {   Click here for ABCD2, HEART and other calculatorsREFRESH Note before signing :1}                          Medical Decision Making Amount and/or Complexity of Data Reviewed Labs: ordered. Radiology: ordered.   This patient presents to the ED with concern for sharp left-sided chest pain, hemoptysis. This involves an extensive number of treatment options, and is a complaint that carries with it a high risk of complications and morbidity.  The differential diagnosis includes pulmonary embolism versus bronchitis versus  pneumonia versus pneumothorax versus other  I did receive phone report from the ED physician at the urgent care, who reported that the x-rays at that time were not showing any pneumothorax or any clear evident pneumonia.  I am not able to directly visualize his x-ray images.  But given the patient's clinical presentation, I have a high enough suspicion for potential pulmonary embolism, that I would proceed with CT imaging at this time, and avoid repeat x-ray imaging.  Thankfully she has minimal pain at this time.  She is mildly hypertensive but otherwise her vitals appear unremarkable.  She is not hypoxic.  Additional history obtained from [facility] physician by phone  I ordered and personally interpreted labs.  The pertinent results include:  ***  I ordered imaging studies including CT PE study I independently visualized and interpreted imaging which showed *** I agree with the radiologist interpretation  The patient was maintained on a cardiac monitor.  I personally viewed and interpreted the cardiac monitored which showed an underlying rhythm of: NSR  Per my interpretation the patient's ECG shows NSR with no acute ischemic findings, no evidence of pericarditis  I have reviewed the patients home medicines and have made adjustments as needed  Test Considered: ***  I requested consultation with the ***,  and discussed lab and imaging findings as well as pertinent plan - they recommend: ***  After the interventions noted above, I reevaluated the patient and found that they have: {resolved/improved/worsened:23923::"improved"}  Social Determinants of Health:***  Dispostion:  After consideration of the diagnostic results and the patients response to treatment, I feel that the patent would benefit from ***.   {Document critical care time when appropriate:1} {Document review of labs and clinical decision tools ie heart score, Chads2Vasc2 etc:1}  {Document your independent review of radiology  images, and any outside records:1} {Document your discussion with family members, caretakers, and with consultants:1} {Document social determinants of health affecting pt's care:1} {Document your decision making why or why not admission, treatments were needed:1} Final Clinical Impression(s) / ED Diagnoses Final diagnoses:  None    Rx / DC Orders ED Discharge Orders     None

## 2023-01-10 NOTE — Discharge Instructions (Addendum)
You were diagnosed with pulmonary embolisms today.  These are small blood clots that were found in your lungs, specifically concentrated in the left lung.  A small part of your lung on the left side also appears to have infarcted, which means the tissue may have died from lack of blood flow.  This can be a cause pain on the left side of your chest.  You were started on a blood thinner medicine called Eliquis, she will take as prescribed until you follow-up with the specialist.  You were given a referral to see a hematologist who is a blood specialist.  They will help determine further workup that may be needed for your blood clots.  Please be aware that while you are taking a blood thinner called Eliquis, he should avoid other blood thinning medications.  This most commonly includes "NSAID" (nonsteroidal anti-inflammatory drugs) which are common over-the-counter.  These would include ibuprofen, Advil, Motrin, Aleve, and aspirin.  You can continue taking Tylenol as needed for pain.  Taking a blood thinning medicine also puts you at higher risk of bleeding after trauma, as well as spontaneous or internal bleeding. If you experience any type of significant trauma or injuries, particularly head traumas, you should go to the emergency department for evaluation.  If you notice bloody stools, or begin to feel dizzy or lightheaded, stop taking your Eliquis and contact your doctor's office.  You can continue exercising normally.  Please avoid contact sports or activities that may put you at risk of head trauma.  If you experience sudden lightheadedness, difficulty breathing, loss of consciousness, or worsening chest pain, please call [phone] or return to the emergency department.

## 2023-01-20 NOTE — Progress Notes (Signed)
[facility] [phone]) [phone]   [phone]  INITIAL CONSULT NOTE  Patient Care Team: Patient, No Pcp Per as PCP - General (General Practice)  Hematological/Oncological History 01/10/2023: Presented to ED or sharp left sided chest pain, hemoptysis. CTA chest showed pulmonary emboli identified scattered in the lung bases. In particular significant clot burden in the left lower lobeDoppler US showed occlusive DVT in the right lower extremity at saphenofemoral junction, in the femoral vein, and in the popliteal vein. There is nonocclusive thrombus in the right common femoral vein. Patient was discharged on Eliquis starter pack.  01/21/2023: Establish care with [facility] hematology  CHIEF COMPLAINTS/PURPOSE OF CONSULTATION:  Right lower extremity DVT and pulmonary emboli  HISTORY OF PRESENTING ILLNESS:  Dawn Arnold 52 y.o. female with medical history significant for diverticulitis and ectopic pregnancy presents to the hematology clinic for recent diagnosis of right lower extremity DVT. She is unaccompanied for this visit.   On exam today, Dawn Arnold reports that improvement of breathing and only has some tightness with deep inspirations. She reports no evidence of swelling in her right lower extremity but has occasional pain in the right thigh and behind the knee. She plans to remove her Nuvaring on May 1st and is in discussion with her OB/GYN about alternatives including progesterone based medications. She is tolerating Eliquis therapy without any bruising or bleeding. She is otherwise feeling well without any new or concerning symptoms. She denies fevers, chills, sweats, chest pain, cough, nausea, vomiting or diarrhea. She has no other complaints. Rest of the ROS is below.   MEDICAL HISTORY:  Past Medical History:  Diagnosis Date   Diverticulitis    Ectopic pregnancy    Pregnancy-induced hypertension     SURGICAL HISTORY: Past Surgical History:  Procedure  Laterality Date   MENISCUS REPAIR      SOCIAL HISTORY: Social History   Socioeconomic History   Marital status: Married    Spouse name: Not on file   Number of children: Not on file   Years of education: Not on file   Highest education level: Not on file  Occupational History   Not on file  Tobacco Use   Smoking status: Never   Smokeless tobacco: Not on file  Substance and Sexual Activity   Alcohol use: Never   Drug use: Never   Sexual activity: Not on file  Other Topics Concern   Not on file  Social History Narrative   Not on file   Social Determinants of Health   Financial Resource Strain: Not on file  Food Insecurity: No Food Insecurity (01/21/2023)   Hunger Vital Sign    Worried About Running Out of Food in the Last Year: Never true    Ran Out of Food in the Last Year: Never true  Transportation Needs: No Transportation Needs (01/21/2023)   PRAPARE - Administrator, Civil Service (Medical): No    Lack of Transportation (Non-Medical): No  Physical Activity: Not on file  Stress: Not on file  Social Connections: Not on file  Intimate Partner Violence: Not At Risk (01/21/2023)   Humiliation, Afraid, Rape, and Kick questionnaire    Fear of Current or Ex-Partner: No    Emotionally Abused: No    Physically Abused: No    Sexually Abused: No    FAMILY HISTORY: No family history on file.  ALLERGIES:  has No Known Allergies.  MEDICATIONS:  Current Outpatient Medications  Medication Sig Dispense Refill   acetaminophen (TYLENOL) 325 MG  tablet Take 3 tablets (975 mg total) by mouth every 8 (eight) hours as needed for up to 30 doses. 30 tablet 0   apixaban (ELIQUIS) 5 MG TABS tablet Take 1 tablet (5 mg total) by mouth 2 (two) times daily. 60 tablet 6   polycarbophil (FIBERCON) 625 MG tablet Take 625 mg by mouth daily. gummie     etonogestrel-ethinyl estradiol (NUVARING) 0.12-0.015 MG/24HR vaginal ring Take 0.12 mg by mouth once. (Patient not taking: Reported on  01/21/2023)     ibuprofen (ADVIL) 200 MG tablet Take 200 mg by mouth every 6 (six) hours as needed. (Patient not taking: Reported on 01/21/2023)     No current facility-administered medications for this visit.    REVIEW OF SYSTEMS:   Constitutional: ( - ) fevers, ( - )  chills , ( - ) night sweats Eyes: ( - ) blurriness of vision, ( - ) double vision, ( - ) watery eyes Ears, nose, mouth, throat, and face: ( - ) mucositis, ( - ) sore throat Respiratory: ( - ) cough, ( - ) dyspnea, ( - ) wheezes Cardiovascular: ( - ) palpitation, ( - ) chest discomfort, ( - ) lower extremity swelling Gastrointestinal:  ( - ) nausea, ( - ) heartburn, ( - ) change in bowel habits Skin: ( - ) abnormal skin rashes Lymphatics: ( - ) new lymphadenopathy, ( - ) easy bruising Neurological: ( - ) numbness, ( - ) tingling, ( - ) new weaknesses Behavioral/Psych: ( - ) mood change, ( - ) new changes  All other systems were reviewed with the patient and are negative.  PHYSICAL EXAMINATION: ECOG PERFORMANCE STATUS: 1 - Symptomatic but completely ambulatory  Vitals:   01/21/23 1453  BP: 134/82  Pulse: 65  Resp: 12  Temp: 98.1 F (36.7 C)  SpO2: 99%   Filed Weights   01/21/23 1453  Weight: 150 lb 1.6 oz (68.1 kg)    GENERAL: well appearing famle in NAD  SKIN: skin color, texture, turgor are normal, no rashes or significant lesions EYES: conjunctiva are pink and non-injected, sclera clear OROPHARYNX: no exudate, no erythema; lips, buccal mucosa, and tongue normal  NECK: supple, non-tender LYMPH:  no palpable lymphadenopathy in the cervical or supraclavicular lymph nodes.  LUNGS: clear to auscultation and percussion with normal breathing effort HEART: regular rate & rhythm and no murmurs and no lower extremity edema Musculoskeletal: no cyanosis of digits and no clubbing  PSYCH: alert & oriented x 3, fluent speech NEURO: no focal motor/sensory deficits  LABORATORY DATA:  I have reviewed the data as listed     Latest Ref Rng & Units 01/21/2023    3:58 PM 01/10/2023    4:23 PM 11/04/2018   12:08 PM  CBC  WBC 4.0 - 10.5 K/uL 9.8  11.3  10.9   Hemoglobin 12.0 - 15.0 g/dL 16.1  09.6  04.5   Hematocrit 36.0 - 46.0 % 44.3  37.3  38.1   Platelets 150 - 400 K/uL 297  273  290        Latest Ref Rng & Units 01/21/2023    3:58 PM 01/10/2023    4:23 PM 11/04/2018   12:08 PM  CMP  Glucose 70 - 99 mg/dL 94  409  811   BUN 6 - 20 mg/dL 20  24  9    Creatinine 0.44 - 1.00 mg/dL 9.14  7.82  9.56   Sodium 135 - 145 mmol/L 138  135  137   Potassium  3.5 - 5.1 mmol/L 4.2  4.4  3.8   Chloride 98 - 111 mmol/L 101  104  107   CO2 22 - 32 mmol/L 28  24  23    Calcium 8.9 - 10.3 mg/dL 9.5  8.7  9.1   Total Protein 6.5 - 8.1 g/dL 8.3     Total Bilirubin 0.3 - 1.2 mg/dL 0.3     Alkaline Phos 38 - 126 U/L 107     AST 15 - 41 U/L 25     ALT 0 - 44 U/L 31       RADIOGRAPHIC STUDIES: I have personally reviewed the radiological images as listed and agreed with the findings in the report. US Venous Img Lower Bilateral  Result Date: 01/10/2023 CLINICAL DATA:  Pulmonary embolus diagnosed on 01/10/2023, assessment for deep vein thrombosis/source EXAM: BILATERAL LOWER EXTREMITY VENOUS DOPPLER ULTRASOUND TECHNIQUE: Gray-scale sonography with compression, as well as color and duplex ultrasound, were performed to evaluate the deep venous system(s) from the level of the common femoral vein through the popliteal and proximal calf veins. COMPARISON:  None Available. FINDINGS: VENOUS In the right lower extremity, there is occlusive DVT at the saphenofemoral junction, in the femoral vein, and in the popliteal vein. Nonocclusive thrombus in the right common femoral vein. In the left lower extremity, no DVT is identified. OTHER None. Limitations: none IMPRESSION: 1. In this patient with known acute pulmonary embolus, there is occlusive DVT in the RIGHT lower extremity at the saphenofemoral junction, in the femoral vein, and in the popliteal  vein. There is nonocclusive thrombus in the right common femoral vein. 2. No DVT is identified in the LEFT lower extremity. Electronically Signed   By: Gaylyn Rong M.D.   On: 01/10/2023 18:20   CT Angio Chest PE W and/or Wo Contrast  Result Date: 01/10/2023 CLINICAL DATA:  Hemoptysis. Pulmonary embolism. Left-sided chest pain EXAM: CT ANGIOGRAPHY CHEST WITH CONTRAST TECHNIQUE: Multidetector CT imaging of the chest was performed using the standard protocol during bolus administration of intravenous contrast. Multiplanar CT image reconstructions and MIPs were obtained to evaluate the vascular anatomy. RADIATION DOSE REDUCTION: This exam was performed according to the departmental dose-optimization program which includes automated exposure control, adjustment of the mA and/or kV according to patient size and/or use of iterative reconstruction technique. CONTRAST:  75mL OMNIPAQUE IOHEXOL 350 MG/ML SOLN COMPARISON:  old CT angiogram 11/04/2018 and x-ray FINDINGS: Cardiovascular: Heart is nonenlarged. Trace pericardial fluid. Normal caliber thoracic aorta. Bilateral pulmonary emboli are identified including segmental and peripheral vessels. In particular there is significant clot in the left lower lobe with additional small areas in the middle lobe, lingula right lower lobe. No CT evidence of right heart strain. Please correlate with clinical presentation. Mediastinum/Nodes: No abnormal lymph node enlargement seen in the axillary region, hilum. There are some small mediastinal nodes seen, nonpathologic by size criteria. Small thyroid gland. Patulous esophagus. Lungs/Pleura: There is a wedge-shaped area of opacity with ground-glass seen in the peripheral left lower lobe in the area of the greatest clot burden. Although there is a differential this very well could be a developing pulmonary infarct. Otherwise no pneumothorax. Trace left pleural fluid. Upper Abdomen: The adrenal glands are preserved in the upper  abdomen. Musculoskeletal: Scattered Schmorl's node changes along the thoracic spine. Review of the MIP images confirms the above findings. Critical Value/emergent results were called by telephone at the time of interpretation on 01/10/2023 at 2:22 pm to provider MATTHEW TRIFAN , who verbally acknowledged these results.  IMPRESSION: Pulmonary emboli identified scattered in the lung bases. In particular significant clot burden in the left lower lobe. Peripheral wedge-shaped area of opacity and ground-glass dependently along the left lower lobe. Although there is differential for the infiltrative this could represent a pulmonary infarct with the distribution of thrombus. Tiny left pleural effusion Electronically Signed   By: Karen Kays M.D.   On: 01/10/2023 17:28    ASSESSMENT & PLAN Chaselynn Draft is a 52 y.o. female who presents to the hematology clinic for recent diagnosis of bilateral PE and right lower extremity DVT. We discuss possible etiologies that can provoke a venous thromboembolism (VTE) including prolonged travel/immobility, surgery (particular abdominal or orthropedic), trauma,  and pregnancy/ estrogen containing birth control. This patient was reported to have on estrogen based birth control, which would qualify as a transient provoking factor. As such we would recommend a minimum of 6 months of anticoagulation therapy with consideration of additional therapy if symptoms persist.  #Right Lower Extremity DVT/Pulmonary Emboli --findings at this time are consistent with a provoked VTE but since patient has been on Nuvaring for several years, need to rule out other causes.  --will order baseline CMP and CBC to assure labs are adequate for DOAC therapy --will order hypercoagulable workup to evaluate for clotting disorders --will order CT venogram to evaluate for May-Therner's syndrome --recommend the patient continue eliquis  5mg  BID. Refill medication. --patient denies any bleeding, bruising, or  dark stools on this medication. It is well tolerated. No difficulties accessing/affording the medication --RTC in 6 months' time with strict return precautions for overt signs of bleeding.   Orders Placed This Encounter  Procedures   CT VENOGRAM ABD/PEL    Standing Status:   Future    Standing Expiration Date:   01/27/2024    Order Specific Question:   If indicated for the ordered procedure, I authorize the administration of contrast media per Radiology protocol    Answer:   Yes    Order Specific Question:   Does the patient have a contrast media/X-ray dye allergy?    Answer:   No    Order Specific Question:   Is patient pregnant?    Answer:   No    Order Specific Question:   Preferred imaging location?    Answer:   [facility]   CBC with Differential (Cancer Center Only)    Standing Status:   Future    Number of Occurrences:   1    Standing Expiration Date:   01/21/2024   CMP (Cancer Center only)    Standing Status:   Future    Number of Occurrences:   1    Standing Expiration Date:   01/21/2024   Antithrombin III   Protein C activity   Protein C, total   Protein S activity   Protein S, total   Lupus anticoagulant panel   Beta-2-glycoprotein i abs, IgG/M/A   Homocysteine, serum   Factor 5 leiden   Prothrombin gene mutation   Cardiolipin antibodies, IgG, IgM, IgA    All questions were answered. The patient knows to call the clinic with any problems, questions or concerns.  I have spent a total of 60 minutes minutes of face-to-face and non-face-to-face time, preparing to see the patient, obtaining and/or reviewing separately obtained history, performing a medically appropriate examination, counseling and educating the patient, ordering medications/tests/procedures, referring and communicating with other health care professionals, documenting clinical information in the electronic health record,and care coordination.   Georga Kaufmann,  PA-C Department of  Hematology/Oncology [facility] Phone: [phone]  Patient was seen with Dr. Mikey Kirschner  .Patient was Personally and independently interviewed, examined and relevant elements of the history of present illness were reviewed in details and an assessment and plan was created. All elements of the patient's history of present illness , assessment and plan were discussed in details with Georga Kaufmann, PA-C. The above documentation reflects our combined findings assessment and plan.   Wyvonnia Lora MD MS

## 2023-01-21 ENCOUNTER — Other Ambulatory Visit: Payer: Self-pay

## 2023-01-21 ENCOUNTER — Inpatient Hospital Stay: Payer: BC Managed Care – PPO | Attending: Physician Assistant | Admitting: Physician Assistant

## 2023-01-21 ENCOUNTER — Inpatient Hospital Stay: Payer: BC Managed Care – PPO

## 2023-01-21 VITALS — BP 134/82 | HR 65 | Temp 98.1°F | Resp 12 | Wt 150.1 lb

## 2023-01-21 DIAGNOSIS — Z7901 Long term (current) use of anticoagulants: Secondary | ICD-10-CM | POA: Diagnosis not present

## 2023-01-21 DIAGNOSIS — I2699 Other pulmonary embolism without acute cor pulmonale: Secondary | ICD-10-CM | POA: Diagnosis present

## 2023-01-21 DIAGNOSIS — I824Y1 Acute embolism and thrombosis of unspecified deep veins of right proximal lower extremity: Secondary | ICD-10-CM

## 2023-01-21 DIAGNOSIS — Z86711 Personal history of pulmonary embolism: Secondary | ICD-10-CM

## 2023-01-21 DIAGNOSIS — I82411 Acute embolism and thrombosis of right femoral vein: Secondary | ICD-10-CM | POA: Diagnosis not present

## 2023-01-21 LAB — CMP (CANCER CENTER ONLY)
ALT: 31 U/L (ref 0–44)
AST: 25 U/L (ref 15–41)
Albumin: 4.2 g/dL (ref 3.5–5.0)
Alkaline Phosphatase: 107 U/L (ref 38–126)
Anion gap: 9 (ref 5–15)
BUN: 20 mg/dL (ref 6–20)
CO2: 28 mmol/L (ref 22–32)
Calcium: 9.5 mg/dL (ref 8.9–10.3)
Chloride: 101 mmol/L (ref 98–111)
Creatinine: 0.92 mg/dL (ref 0.44–1.00)
GFR, Estimated: >60 mL/min (ref >60)
Glucose, Bld: 94 mg/dL (ref 70–99)
Potassium: 4.2 mmol/L (ref 3.5–5.1)
Sodium: 138 mmol/L (ref 135–145)
Total Bilirubin: 0.3 mg/dL (ref 0.3–1.2)
Total Protein: 8.3 g/dL — ABNORMAL HIGH (ref 6.5–8.1)

## 2023-01-21 LAB — CBC WITH DIFFERENTIAL (CANCER CENTER ONLY)
Abs Immature Granulocytes: 0.03 10*3/uL (ref 0.00–0.07)
Basophils Absolute: 0.2 10*3/uL — ABNORMAL HIGH (ref 0.0–0.1)
Basophils Relative: 2 %
Eosinophils Absolute: 0.7 10*3/uL — ABNORMAL HIGH (ref 0.0–0.5)
Eosinophils Relative: 7 %
HCT: 44.3 % (ref 36.0–46.0)
Hemoglobin: 14.7 g/dL (ref 12.0–15.0)
Immature Granulocytes: 0 %
Lymphocytes Relative: 32 %
Lymphs Abs: 3.2 10*3/uL (ref 0.7–4.0)
MCH: 31.7 pg (ref 26.0–34.0)
MCHC: 33.2 g/dL (ref 30.0–36.0)
MCV: 95.7 fL (ref 80.0–100.0)
Monocytes Absolute: 0.8 10*3/uL (ref 0.1–1.0)
Monocytes Relative: 8 %
Neutro Abs: 5 10*3/uL (ref 1.7–7.7)
Neutrophils Relative %: 51 %
Platelet Count: 297 10*3/uL (ref 150–400)
RBC: 4.63 MIL/uL (ref 3.87–5.11)
RDW: 11.9 % (ref 11.5–15.5)
WBC Count: 9.8 10*3/uL (ref 4.0–10.5)
nRBC: 0 % (ref 0.0–0.2)

## 2023-01-21 LAB — ANTITHROMBIN III: AntiThromb III Func: 119 % (ref 75–120)

## 2023-01-22 LAB — LUPUS ANTICOAGULANT PANEL
DRVVT: 42 s (ref 0.0–47.0)
PTT Lupus Anticoagulant: 32.2 s (ref 0.0–43.5)

## 2023-01-22 LAB — HOMOCYSTEINE: Homocysteine: 12.9 umol/L (ref 0.0–14.5)

## 2023-01-22 LAB — PROTEIN C ACTIVITY: Protein C Activity: 171 % (ref 73–180)

## 2023-01-22 LAB — PROTEIN S ACTIVITY: Protein S Activity: 99 % (ref 63–140)

## 2023-01-22 LAB — PROTEIN S, TOTAL: Protein S Ag, Total: 104 % (ref 60–150)

## 2023-01-23 LAB — PROTEIN C, TOTAL: Protein C, Total: 174 % — ABNORMAL HIGH (ref 60–150)

## 2023-01-23 LAB — BETA-2-GLYCOPROTEIN I ABS, IGG/M/A
Beta-2 Glyco I IgG: <9 GPI IgG units (ref 0–20)
Beta-2-Glycoprotein I IgA: <9 GPI IgA units (ref 0–25)
Beta-2-Glycoprotein I IgM: <9 GPI IgM units (ref 0–32)

## 2023-01-23 MED ORDER — APIXABAN 5 MG PO TABS: 5.0000 mg | ORAL_TABLET | Freq: Two times a day (BID) | ORAL | 6 refills | Status: DC

## 2023-01-24 LAB — CARDIOLIPIN ANTIBODIES, IGG, IGM, IGA
Anticardiolipin IgA: <9 APL U/mL (ref 0–11)
Anticardiolipin IgG: <9 GPL U/mL (ref 0–14)
Anticardiolipin IgM: <9 MPL U/mL (ref 0–12)

## 2023-02-02 ENCOUNTER — Emergency Department (HOSPITAL_COMMUNITY)
Admission: EM | Admit: 2023-02-02 | Discharge: 2023-02-02 | Disposition: A | Discharge status: Home / Self Care | Payer: BC Managed Care – PPO | Attending: Emergency Medicine | Admitting: Emergency Medicine

## 2023-02-02 ENCOUNTER — Emergency Department (HOSPITAL_COMMUNITY): Payer: BC Managed Care – PPO

## 2023-02-02 ENCOUNTER — Encounter (HOSPITAL_COMMUNITY): Payer: Self-pay | Admitting: Emergency Medicine

## 2023-02-02 ENCOUNTER — Other Ambulatory Visit: Payer: Self-pay

## 2023-02-02 DIAGNOSIS — R42 Dizziness and giddiness: Secondary | ICD-10-CM | POA: Insufficient documentation

## 2023-02-02 DIAGNOSIS — R079 Chest pain, unspecified: Secondary | ICD-10-CM | POA: Insufficient documentation

## 2023-02-02 DIAGNOSIS — Z7901 Long term (current) use of anticoagulants: Secondary | ICD-10-CM | POA: Diagnosis not present

## 2023-02-02 DIAGNOSIS — F419 Anxiety disorder, unspecified: Secondary | ICD-10-CM | POA: Diagnosis not present

## 2023-02-02 HISTORY — DX: Other pulmonary embolism without acute cor pulmonale: I26.99

## 2023-02-02 LAB — URINALYSIS, ROUTINE W REFLEX MICROSCOPIC
Bilirubin Urine: NEGATIVE
Glucose, UA: NEGATIVE mg/dL
Hgb urine dipstick: NEGATIVE
Ketones, ur: NEGATIVE mg/dL
Leukocytes,Ua: NEGATIVE
Nitrite: NEGATIVE
Protein, ur: NEGATIVE mg/dL
Specific Gravity, Urine: 1.01 (ref 1.005–1.030)
pH: 7 (ref 5.0–8.0)

## 2023-02-02 LAB — CBC
HCT: 40.9 % (ref 36.0–46.0)
Hemoglobin: 13.6 g/dL (ref 12.0–15.0)
MCH: 31.1 pg (ref 26.0–34.0)
MCHC: 33.3 g/dL (ref 30.0–36.0)
MCV: 93.6 fL (ref 80.0–100.0)
Platelets: 346 10*3/uL (ref 150–400)
RBC: 4.37 MIL/uL (ref 3.87–5.11)
RDW: 11.8 % (ref 11.5–15.5)
WBC: 10.6 10*3/uL — ABNORMAL HIGH (ref 4.0–10.5)
nRBC: 0 % (ref 0.0–0.2)

## 2023-02-02 LAB — BASIC METABOLIC PANEL
Anion gap: 12 (ref 5–15)
BUN: 13 mg/dL (ref 6–20)
CO2: 23 mmol/L (ref 22–32)
Calcium: 9.5 mg/dL (ref 8.9–10.3)
Chloride: 101 mmol/L (ref 98–111)
Creatinine, Ser: 0.89 mg/dL (ref 0.44–1.00)
GFR, Estimated: >60 mL/min (ref >60)
Glucose, Bld: 105 mg/dL — ABNORMAL HIGH (ref 70–99)
Potassium: 4.1 mmol/L (ref 3.5–5.1)
Sodium: 136 mmol/L (ref 135–145)

## 2023-02-02 LAB — TROPONIN I (HIGH SENSITIVITY)
Troponin I (High Sensitivity): 5 ng/L (ref <18)
Troponin I (High Sensitivity): 3 ng/L (ref <18)

## 2023-02-02 LAB — CBG MONITORING, ED: Glucose-Capillary: 79 mg/dL (ref 70–99)

## 2023-02-02 MED ORDER — IOHEXOL 350 MG/ML SOLN
75.0000 mL | Freq: Once | INTRAVENOUS | Status: AC | PRN
  Administered 2023-02-02: 75 mL via INTRAVENOUS

## 2023-02-02 MED ORDER — SODIUM CHLORIDE 0.9 % IV BOLUS
1000.0000 mL | Freq: Once | INTRAVENOUS | Status: AC
  Administered 2023-02-02: 1000 mL via INTRAVENOUS

## 2023-02-02 NOTE — ED Notes (Signed)
Pt transported to CT via stretcher.  

## 2023-02-02 NOTE — ED Notes (Signed)
AVS provided to and discussed with patient and family member at bedside. Pt verbalizes understanding of discharge instructions and denies any questions or concerns at this time. Pt has ride home. Pt ambulated out of department independently with steady gait.  

## 2023-02-02 NOTE — ED Notes (Signed)
Lab called to check on status on troponin. Not in process. Running now.

## 2023-02-02 NOTE — ED Provider Notes (Signed)
[place] EMERGENCY DEPARTMENT AT [facility] Provider Note   CSN: [id number] Arrival date & time: 02/02/23  1025     History  Chief Complaint  Patient presents with   Dizziness   Chest Pain    Samuella Bello is a 52 y.o. female.  52 year old for female with prior medical history as detailed below presents for evaluation.  Patient with recent diagnosis of PE and DVT.  Patient reports that she has been fully compliant with Eliquis.  Her last dose of Eliquis was this morning.  While going to church this morning she had short episode of lightheadedness.  She became anxious.  She denies chest pain.  She denies shortness of breath.  She denies bleeding.  She denies recent illness such as fever, cough.  The history is provided by the patient and medical records.       Home Medications Prior to Admission medications   Medication Sig Start Date End Date Taking? Authorizing Provider  acetaminophen (TYLENOL) 325 MG tablet Take 3 tablets (975 mg total) by mouth every 8 (eight) hours as needed for up to 30 doses. 01/10/23   Terald Sleeper, MD  apixaban (ELIQUIS) 5 MG TABS tablet Take 1 tablet (5 mg total) by mouth 2 (two) times daily. 01/23/23   Briant Cedar, PA-C  ibuprofen (ADVIL) 200 MG tablet Take 200 mg by mouth every 6 (six) hours as needed. Patient not taking: Reported on 01/21/2023    [provider]  polycarbophil (FIBERCON) 625 MG tablet Take 625 mg by mouth daily. gummie    [provider]      Allergies    Patient has no known allergies.    Review of Systems   Review of Systems  All other systems reviewed and are negative.   Physical Exam Updated Vital Signs BP 126/78   Pulse 61   Temp 98.3 F (36.8 C)   Resp 18   LMP 01/19/2023   SpO2 99%  Physical Exam Vitals and nursing note reviewed.  Constitutional:      General: She is not in acute distress.    Appearance: Normal appearance. She is well-developed.  HENT:     Head:  Normocephalic and atraumatic.  Eyes:     Conjunctiva/sclera: Conjunctivae normal.     Pupils: Pupils are equal, round, and reactive to light.  Cardiovascular:     Rate and Rhythm: Normal rate and regular rhythm.     Heart sounds: Normal heart sounds.  Pulmonary:     Effort: Pulmonary effort is normal. No respiratory distress.     Breath sounds: Normal breath sounds.  Abdominal:     General: There is no distension.     Palpations: Abdomen is soft.     Tenderness: There is no abdominal tenderness.  Musculoskeletal:        General: No deformity. Normal range of motion.     Cervical back: Normal range of motion and neck supple.  Skin:    General: Skin is warm and dry.  Neurological:     General: No focal deficit present.     Mental Status: She is alert and oriented to person, place, and time.     ED Results / Procedures / Treatments   Labs (all labs ordered are listed, but only abnormal results are displayed) Labs Reviewed  BASIC METABOLIC PANEL - Abnormal; Notable for the following components:      Result Value   Glucose, Bld 105 (*)    All other  components within normal limits  CBC - Abnormal; Notable for the following components:   WBC 10.6 (*)    All other components within normal limits  URINALYSIS, ROUTINE W REFLEX MICROSCOPIC - Abnormal; Notable for the following components:   APPearance CLOUDY (*)    All other components within normal limits  CBG MONITORING, ED  I-STAT BETA HCG BLOOD, ED ([facility], [facility], [facility] ONLY)  TROPONIN I (HIGH SENSITIVITY)  TROPONIN I (HIGH SENSITIVITY)    EKG None  Radiology No results found.  Procedures Procedures    Medications Ordered in ED Medications  sodium chloride 0.9 % bolus 1,000 mL (0 mLs Intravenous Stopped 02/02/23 1242)    ED Course/ Medical Decision Making/ A&P                             Medical Decision Making Amount and/or Complexity of Data Reviewed Labs: ordered. Radiology: ordered.  Risk Prescription drug  management.    Medical Screen Complete  This patient presented to the ED with complaint of transient lightheadedness.  This complaint involves an extensive number of treatment options. The initial differential diagnosis includes, but is not limited to, metabolic abnormality, Eliquis failure in the setting of known DVT PE, etc.  This presentation is: Acute, Chronic, Self-Limited, Previously Undiagnosed, Uncertain Prognosis, Complicated, Systemic Symptoms, and Threat to Life/Bodily Function  Patient with recently diagnosed DVT/PE.  Patient with transient episode of lightheadedness.  On evaluation in the ED she is asymptomatic.  She is anxious about possible pathology related to recent diagnosis of DVT/PE.  Exam, including vitals are without significant abnormality.  Screening labs obtained are without significant abnormality.  Patient's white count is 10.6.  Hemoglobin is 13.6.  Electrolytes and renal function within normal limits.  CT imaging without acute pathology.  Patient is reassured and feels improved after evaluation.  Patient does understand need for close outpatient follow-up and strict return precautions given and understood.  NAD    Additional history obtained:  External records from outside sources obtained and reviewed including prior ED visits and prior Inpatient records.    Lab Tests:  I ordered and personally interpreted labs.     Imaging Studies ordered:  I ordered imaging studies including CTA chest I independently visualized and interpreted obtained imaging which showed NAD I agree with the radiologist interpretation.   Cardiac Monitoring:  The patient was maintained on a cardiac monitor.  I personally viewed and interpreted the cardiac monitor which showed an underlying rhythm of: NSR   Problem List / ED Course:  Dizziness/lightheaded   Reevaluation:  After the interventions noted above, I reevaluated the patient and found that they have:  improved  Disposition:  After consideration of the diagnostic results and the patients response to treatment, I feel that the patent would benefit from close outpatient followup.          Final Clinical Impression(s) / ED Diagnoses Final diagnoses:  Dizziness    Rx / DC Orders ED Discharge Orders     None         Wynetta Fines, MD 02/02/23 1549

## 2023-02-02 NOTE — ED Notes (Signed)
Lab called to add on troponin 

## 2023-02-02 NOTE — Discharge Instructions (Addendum)
Return for any problem.  ?

## 2023-02-02 NOTE — ED Triage Notes (Signed)
Pt. Stated, Dawn Arnold had some light headedness this morning while driving to church. Dawn Arnold also had some chest tightness not sure if that's just anxiety cause I had a pulmonary embolism on April 26.

## 2023-02-02 NOTE — ED Notes (Signed)
Pt transported back to room from CT 

## 2023-02-03 LAB — PROTHROMBIN GENE MUTATION

## 2023-02-11 LAB — FACTOR 5 LEIDEN

## 2023-02-12 ENCOUNTER — Encounter: Payer: Self-pay | Admitting: Hematology

## 2023-02-12 ENCOUNTER — Ambulatory Visit (HOSPITAL_COMMUNITY)
Admission: RE | Admit: 2023-02-12 | Discharge: 2023-02-12 | Disposition: A | Discharge status: Home / Self Care | Payer: BC Managed Care – PPO | Source: Ambulatory Visit | Attending: Physician Assistant | Admitting: Physician Assistant

## 2023-02-12 ENCOUNTER — Telehealth: Payer: Self-pay | Admitting: Physician Assistant

## 2023-02-12 ENCOUNTER — Other Ambulatory Visit: Payer: Self-pay | Admitting: Hematology

## 2023-02-12 DIAGNOSIS — I824Y1 Acute embolism and thrombosis of unspecified deep veins of right proximal lower extremity: Secondary | ICD-10-CM | POA: Diagnosis present

## 2023-02-12 DIAGNOSIS — I871 Compression of vein: Secondary | ICD-10-CM

## 2023-02-12 MED ORDER — IOHEXOL 350 MG/ML SOLN
100.0000 mL | Freq: Once | INTRAVENOUS | Status: AC | PRN
  Administered 2023-02-12: 100 mL via INTRAVENOUS

## 2023-02-12 NOTE — Telephone Encounter (Signed)
I called Ms. Dawn Arnold to review the hypercoagulable workup and CT venogram results.  There was no clotting disorder identified. 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 Dr. Candise Che recommends referral to IR to evaluate for stenting. Patient recommends to continue on anticoagulation in the interim. She will return on 06/24/2023 with Dr. Candise Che for a follow up visit.

## 2023-02-13 ENCOUNTER — Emergency Department (HOSPITAL_BASED_OUTPATIENT_CLINIC_OR_DEPARTMENT_OTHER): Payer: BC Managed Care – PPO

## 2023-02-13 ENCOUNTER — Emergency Department (HOSPITAL_COMMUNITY)
Admission: EM | Admit: 2023-02-13 | Discharge: 2023-02-13 | Disposition: A | Discharge status: Home / Self Care | Payer: BC Managed Care – PPO | Attending: Emergency Medicine | Admitting: Emergency Medicine

## 2023-02-13 ENCOUNTER — Encounter (HOSPITAL_COMMUNITY): Payer: Self-pay | Admitting: *Deleted

## 2023-02-13 ENCOUNTER — Other Ambulatory Visit: Payer: Self-pay | Admitting: Physician Assistant

## 2023-02-13 ENCOUNTER — Other Ambulatory Visit: Payer: Self-pay

## 2023-02-13 DIAGNOSIS — Z7901 Long term (current) use of anticoagulants: Secondary | ICD-10-CM | POA: Insufficient documentation

## 2023-02-13 DIAGNOSIS — M7989 Other specified soft tissue disorders: Secondary | ICD-10-CM | POA: Diagnosis not present

## 2023-02-13 DIAGNOSIS — R2241 Localized swelling, mass and lump, right lower limb: Secondary | ICD-10-CM | POA: Diagnosis present

## 2023-02-13 DIAGNOSIS — I824Y1 Acute embolism and thrombosis of unspecified deep veins of right proximal lower extremity: Secondary | ICD-10-CM

## 2023-02-13 DIAGNOSIS — I82501 Chronic embolism and thrombosis of unspecified deep veins of right lower extremity: Secondary | ICD-10-CM | POA: Diagnosis not present

## 2023-02-13 DIAGNOSIS — Z86711 Personal history of pulmonary embolism: Secondary | ICD-10-CM

## 2023-02-13 NOTE — ED Provider Notes (Signed)
I provided a substantive portion of the care of this patient.  I personally made/approved the management plan for this patient and take responsibility for the patient management.    Patient seen by me along with physician assistant.  Patient here with concerns for some increased swelling in her right lower extremity.  Mid April patient was diagnosed with pulmonary embolus and extensive deep vein thrombosis to the right lower extremity.  Patient was concerned that maybe is getting worse.  Patient has been seen by hematology they referred her to vascular surgery but she has not heard from them yet patient has follow-up with hematology in the fall.  She is taking her Eliquis religiously.  No chest pain no shortness of breath.  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  Partial recanalization of the proximal popliteal vein occlusive thrombus of the distal popliteal vein with dilatation noted.  This all seems to be a little bit better than where she was.  Also the initial deep vein thrombosis was not that long ago.  Patient stable for discharge home continue Eliquis will give her phone number for vascular surgery so she can call and set up an appointment.  Also she was referred to interventional radiology for a problem the left groin area where her femoral artery is compressing on her femoral vein.  But she does not have any symptoms in the left leg.     Vanetta Mulders, MD 02/13/23 2037

## 2023-02-13 NOTE — Discharge Instructions (Addendum)
The ultrasound today appears improved from previous. Continue your Eliquis. Follow up with vascular as discussed. I provided our on call vascular specialist. Call the office tomorrow to schedule an appointment.   Follow up with your PCP to discuss any continued issues. If you do not have a PCP, I provided 2 clinics you may follow up with. Return to ED for any new or worsening symptoms.

## 2023-02-13 NOTE — ED Provider Notes (Signed)
[place] EMERGENCY DEPARTMENT AT [facility] Provider Note   CSN: [id number] Arrival date & time: 02/13/23  1827     History  Chief Complaint  Patient presents with   Leg Swelling    Dawn Arnold is a 52 y.o. 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  She reports that she has been adherent with Eliquis regimen.  States that today she felt a tightness around her knee and an episode of worsening swelling from her baseline.  Believes that that DVT/PE was provoked secondary to need for renal cues.  No recent travel, recent surgery.  NuvaRing discontinued.  No associated fever, chills, chest pain, shortness of breath, palpitations, lightheadedness, syncope.  Patient had repeat CT chest on 02/02/2023 that showed significantly improved pulmonary embolism.  Reports today that she only came in due to episode of pain and swelling in the right leg.  Was evaluated outpatient by hematology with reassuring workup and has been referred to vascular for further evaluation but not yet heard from them.      Home Medications Prior to Admission medications   Medication Sig Start Date End Date Taking? Authorizing Provider  acetaminophen (TYLENOL) 325 MG tablet Take 3 tablets (975 mg total) by mouth every 8 (eight) hours as needed for up to 30 doses. 01/10/23   Terald Sleeper, MD  apixaban (ELIQUIS) 5 MG TABS tablet Take 1 tablet (5 mg total) by mouth 2 (two) times daily. 01/23/23   Briant Cedar, PA-C  ibuprofen (ADVIL) 200 MG tablet Take 200 mg by mouth every 6 (six) hours as needed. Patient not taking: Reported on 01/21/2023    [provider]  polycarbophil (FIBERCON) 625 MG tablet Take 625 mg by mouth daily. gummie    [provider]      Allergies    Patient has no known allergies.    Review of Systems   Review of Systems  All  other systems reviewed and are negative.   Physical Exam Updated Vital Signs BP 133/82   Pulse 62   Temp 98 F (36.7 C) (Oral)   Resp 16   Wt 67.1 kg   LMP 02/13/2023 (Exact Date)   SpO2 100%   BMI 23.18 kg/m  Physical Exam Vitals and nursing note reviewed.  Constitutional:      General: She is not in acute distress.    Appearance: Normal appearance.  HENT:     Head: Normocephalic and atraumatic.     Mouth/Throat:     Mouth: Mucous membranes are moist.  Eyes:     Conjunctiva/sclera: Conjunctivae normal.  Cardiovascular:     Rate and Rhythm: Normal rate and regular rhythm.     Heart sounds: No murmur heard. Pulmonary:     Effort: Pulmonary effort is normal.     Breath sounds: Normal breath sounds.  Musculoskeletal:        General: Normal range of motion.     Cervical back: Neck supple.     Right lower leg: No edema.     Left lower leg: No edema.     Comments: No calf tenderness bilaterally, no tenderness over any aspect of right lower extremity which is warm and well perfused, 2+ DP and PT pulses, normal sensation and range of motion, soft compartments  Skin:    General: Skin is warm and dry.  Capillary Refill: Capillary refill takes less than 2 seconds.  Neurological:     Mental Status: She is alert. Mental status is at baseline.  Psychiatric:        Behavior: Behavior normal.     ED Results / Procedures / Treatments   Labs (all labs ordered are listed, but only abnormal results are displayed) Labs Reviewed - No data to display  EKG None  Radiology VAS [place] LOWER EXTREMITY VENOUS (DVT) (ONLY [facility] & [facility])  Result Date: 02/13/2023  Lower Venous DVT Study Patient Name:  Dawn Arnold  Date of Exam:   02/13/2023 Medical Rec #: [id number]      Accession #:    [id number] Date of Birth: 1971/07/12      Patient Gender: F Patient Age:   49 years [facility] Procedure:      VAS [place] LOWER EXTREMITY VENOUS (DVT) Referring Phys: Vanetta Mulders  --------------------------------------------------------------------------------  Indications: New pain and swelling of right knee/calf. History of DVT/PE. On Eliquis.  Risk Factors: Patient reports hematology workup with no relevant findings. Anticoagulation: Eliquis. Comparison Study: 01-10-2023 Prior right lower extremity venous study was                   positive for extensive acute DVT involving the CFV, FV, and                   Pop V Performing Technologist: Jean Rosenthal RDMS, RVT  Examination Guidelines: A complete evaluation includes B-mode imaging, spectral Doppler, color Doppler, and power Doppler as needed of all accessible portions of each vessel. Bilateral testing is considered an integral part of a complete examination. Limited examinations for reoccurring indications may be performed as noted. The reflux portion of the exam is performed with the patient in reverse Trendelenburg.  +---------+---------------+---------+-----------+---------------+--------------+ RIGHT    CompressibilityPhasicitySpontaneityProperties     Thrombus Aging +---------+---------------+---------+-----------+---------------+--------------+ CFV      Full           Yes      Yes                                      +---------+---------------+---------+-----------+---------------+--------------+ SFJ      Full                                                             +---------+---------------+---------+-----------+---------------+--------------+ FV Prox  Partial        Yes      Yes        Appears        Age                                                        minimally      Indeterminate                                              recanalized                   +---------+---------------+---------+-----------+---------------+--------------+  FV Mid   Partial        Yes      Yes        Appears        Chronic                                                    minimally                                                                  recanalized                   +---------+---------------+---------+-----------+---------------+--------------+ FV DistalPartial        Yes      Yes        Retrograde     Chronic                                                    flow- partially                                                           recanalized                   +---------+---------------+---------+-----------+---------------+--------------+ PFV      Full                                                             +---------+---------------+---------+-----------+---------------+--------------+ POP      None           No       No         Partially      Age                                                        recanalized    Indeterminate                                              proximal  segment,                                                                  occluded distal                                                           segment with                                                              dilitation                    +---------+---------------+---------+-----------+---------------+--------------+ PTV      Full                                                             +---------+---------------+---------+-----------+---------------+--------------+ PERO     Full                                                             +---------+---------------+---------+-----------+---------------+--------------+ Gastroc  Full                                                             +---------+---------------+---------+-----------+---------------+--------------+   +----+---------------+---------+-----------+----------+--------------+ LEFTCompressibilityPhasicitySpontaneityPropertiesThrombus Aging  +----+---------------+---------+-----------+----------+--------------+ CFV Full           Yes      Yes                                 +----+---------------+---------+-----------+----------+--------------+    Summary: RIGHT: - Findings consistent with age indeterminate deep vein thrombosis involving the right femoral vein, and right popliteal vein. Minimal recanalization of the femoral vein with retrograde flow noted in the distal femoral. Heterogenous, echogenic thrombus. Partial recanalization of the proximal popliteal vein, occlusive thrombus of the distal popliteal vein with dilatation noted. - No cystic structure found in the popliteal fossa.  LEFT: - No evidence of common femoral vein obstruction.  *See table(s) above for measurements and observations.    Preliminary    Procedures Procedures    Medications Ordered in ED Medications - No data to display  ED Course/ Medical Decision Making/ A&P  Medical Decision Making Amount and/or Complexity of Data Reviewed Radiology: ordered. Decision-making details documented in ED Course.   Medical Decision Making:   Muntaha Marrano is a 52 y.o. female who presented to the ED today with leg pain and swelling detailed above.    Additional history discussed with patient's family/caregivers.  Patient's presentation is complicated by their history of DVT/PE.  Complete initial physical exam performed, notably the patient was in no acute distress.  No skin changes to the right lower extremity.  Neurovascularly intact.  No calf tenderness.  No signs of acute distress.    Reviewed and confirmed nursing documentation for past medical history, family history, social history.    Initial Assessment:   With the patient's presentation, differential diagnosis includes but is not limited to DVT, PE, thrombophlebitis, compartment syndrome, calf strain, knee strain, clotting disorder. This is most consistent with an acute  complicated illness  Initial Plan:  Vascular US to evaluate for DVT Objective evaluation as below reviewed   Initial Study Results:   Radiology:  All images reviewed independently. Agree with radiology report at this time.   VAS [place] LOWER EXTREMITY VENOUS (DVT) (ONLY [facility])  Result Date: 02/13/2023  Lower Venous DVT Study Patient Name:  LIZMARY KREBS  Date of Exam:   02/13/2023 Medical Rec #: [id number]      Accession #:    [id number] Date of Birth: 1971/05/22      Patient Gender: F Patient Age:   11 years Exam Location:  [facility] Procedure:      VAS [place] LOWER EXTREMITY VENOUS (DVT) Referring Phys: Vanetta Mulders --------------------------------------------------------------------------------  Indications: New pain and swelling of right knee/calf. History of DVT/PE. On Eliquis.  Risk Factors: Patient reports hematology workup with no relevant findings. Anticoagulation: Eliquis. Comparison Study: 01-10-2023 Prior right lower extremity venous study was                   positive for extensive acute DVT involving the CFV, FV, and                   Pop V Performing Technologist: Jean Rosenthal RDMS, RVT  Examination Guidelines: A complete evaluation includes B-mode imaging, spectral Doppler, color Doppler, and power Doppler as needed of all accessible portions of each vessel. Bilateral testing is considered an integral part of a complete examination. Limited examinations for reoccurring indications may be performed as noted. The reflux portion of the exam is performed with the patient in reverse Trendelenburg.  +---------+---------------+---------+-----------+---------------+--------------+ RIGHT    CompressibilityPhasicitySpontaneityProperties     Thrombus Aging +---------+---------------+---------+-----------+---------------+--------------+ CFV      Full           Yes      Yes                                       +---------+---------------+---------+-----------+---------------+--------------+ SFJ      Full                                                             +---------+---------------+---------+-----------+---------------+--------------+ FV Prox  Partial        Yes      Yes  Appears        Age                                                        minimally      Indeterminate                                              recanalized                   +---------+---------------+---------+-----------+---------------+--------------+ FV Mid   Partial        Yes      Yes        Appears        Chronic                                                    minimally                                                                 recanalized                   +---------+---------------+---------+-----------+---------------+--------------+ FV DistalPartial        Yes      Yes        Retrograde     Chronic                                                    flow- partially                                                           recanalized                   +---------+---------------+---------+-----------+---------------+--------------+ PFV      Full                                                             +---------+---------------+---------+-----------+---------------+--------------+ POP      None           No       No         Partially      Age  recanalized    Indeterminate                                              proximal                                                                  segment,                                                                  occluded distal                                                           segment with                                                              dilitation                     +---------+---------------+---------+-----------+---------------+--------------+ PTV      Full                                                             +---------+---------------+---------+-----------+---------------+--------------+ PERO     Full                                                             +---------+---------------+---------+-----------+---------------+--------------+ Gastroc  Full                                                             +---------+---------------+---------+-----------+---------------+--------------+   +----+---------------+---------+-----------+----------+--------------+ LEFTCompressibilityPhasicitySpontaneityPropertiesThrombus Aging +----+---------------+---------+-----------+----------+--------------+ CFV Full           Yes      Yes                                 +----+---------------+---------+-----------+----------+--------------+    Summary: RIGHT: - Findings consistent with age indeterminate deep vein thrombosis involving the  right femoral vein, and right popliteal vein. Minimal recanalization of the femoral vein with retrograde flow noted in the distal femoral. Heterogenous, echogenic thrombus. Partial recanalization of the proximal popliteal vein, occlusive thrombus of the distal popliteal vein with dilatation noted. - No cystic structure found in the popliteal fossa.  LEFT: - No evidence of common femoral vein obstruction.  *See table(s) above for measurements and observations.    Preliminary    CT VENOGRAM ABD/PEL  Result Date: 02/12/2023 CLINICAL DATA:  52 year old female with prior DVT and possible May-Thurner EXAM: CT VENOGRAM ABDOMEN AND PELVIS TECHNIQUE: Venographic phase images of the abdomen and pelvis were obtained following the administration of intravenous contrast. Multiplanar reformats and maximum intensity projections were generated. RADIATION DOSE REDUCTION: This exam was performed according to the departmental  dose-optimization program which includes automated exposure control, adjustment of the mA and/or kV according to patient size and/or use of iterative reconstruction technique. CONTRAST:  OMNIPAQUE IOHEXOL 350 MG/ML SOLN COMPARISON:  Abdominal CT 06/24/2008, chest CT 02/02/2023, 01/10/2023 FINDINGS: VASCULAR Note that the arterial anatomy not as well characterized given the timing of the contrast bolus Aorta: Unremarkable course, caliber, contour of the abdominal aorta. No dissection, aneurysm, or periaortic fluid. Celiac/SMA: Appears to be a celicomesenteric trunk, without atherosclerotic changes. Renals: - Right: Right renal artery patent. - Left: Left renal artery patent. IMA: Inferior mesenteric artery is patent. Right lower extremity: Unremarkable course, caliber, and contour of the right iliac system. No aneurysm, dissection, or occlusion. Hypogastric artery is patent. Common femoral artery patent. Proximal SFA and profunda femoris patent. Left lower extremity: Unremarkable course, caliber, and contour of the left iliac system. No aneurysm, dissection, or occlusion. Hypogastric artery is patent. Common femoral artery patent. Proximal SFA and profunda femoris patent. Venous Portal venous: The hepatic veins unremarkable and patent. Splenic vein, superior mesenteric vein, inferior mesenteric vein patent. Main portal vein and the left and right portal vein branches are patent. Systemic venous: Unremarkable hepatic IVC. Supra renal IVC unremarkable. Bilateral renal veins are patent and unremarkable Infrarenal IVC unremarkable. The right common iliac vein is patent, unremarkable course caliber and contour. Internal iliac veins patent. Pelvic veins patent. External iliac vein is patent. The visualized proximal femoral veins unremarkable and patent. 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 The common iliac vein appears patent.  Internal iliac vein and pelvic veins are patent. External iliac vein patent and unremarkable caliber. The visualized proximal femoral veins unremarkable. Review of the MIP images confirms the above findings. NON-VASCULAR Lower chest: Involving pulmonary infarction of the left lower lobe, with contraction of the inflammation into a pleural based nodularity. Hepatobiliary: Unremarkable appearance of the liver. Unremarkable gall bladder. Pancreas: Unremarkable. Spleen: Unremarkable. Adrenals/Urinary Tract: - Right adrenal gland: Unremarkable - Left adrenal gland: Unremarkable. - Right kidney: No hydronephrosis, nephrolithiasis, inflammation, or ureteral dilation. No focal lesion. - Left Kidney: No hydronephrosis, nephrolithiasis, inflammation, or ureteral dilation. No focal lesion. - Urinary Bladder: Unremarkable. Stomach/Bowel: - Stomach: Unremarkable. - Small bowel: Unremarkable - Appendix: Appendix is not visualized, however, no inflammatory changes are present adjacent to the cecum to indicate an appendicitis. - Colon: Unremarkable. Lymphatic: No adenopathy. Mesenteric: No free fluid or air. No mesenteric adenopathy. Reproductive: Early fibroid changes of the uterus, with otherwise unremarkable uterus/adnexa. Other: Fat containing umbilical hernia. Musculoskeletal: No evidence of acute fracture. No bony canal narrowing. No significant degenerative changes of the hips. IMPRESSION: No acute CT finding. 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 Evolving left lower lobe pulmonary infarction. Signed, Yvone Neu. Miachel Roux, RPVI [facility] Radiology Electronically Signed   By: Gilmer Mor D.O.   On: 02/12/2023 14:02   CT Angio Chest PE W and/or Wo Contrast  Result Date: 02/02/2023 CLINICAL DATA:  Recurrent acute dyspnea in the setting of known pulmonary emboli EXAM: CT ANGIOGRAPHY CHEST WITH  CONTRAST TECHNIQUE: Multidetector CT imaging of the chest was performed using the standard protocol during bolus administration of intravenous contrast. Multiplanar CT image reconstructions and MIPs were obtained to evaluate the vascular anatomy. RADIATION DOSE REDUCTION: This exam was performed according to the departmental dose-optimization program which includes automated exposure control, adjustment of the mA and/or kV according to patient size and/or use of iterative reconstruction technique. CONTRAST:  75mL OMNIPAQUE IOHEXOL 350 MG/ML SOLN COMPARISON:  CTA chest dated 01/10/2023 FINDINGS: Cardiovascular: The study is high quality for the evaluation of pulmonary embolism. There are no acute filling defects in the central, lobar, segmental or subsegmental pulmonary artery branches to suggest acute pulmonary embolism. Minimal eccentric, chronic-appearing nonocclusive thrombus remains within a left lower lobe subsegmental pulmonary artery (10:242). Great vessels are normal in course and caliber. Normal heart size. No significant pericardial fluid/thickening. Mediastinum/Nodes: Imaged thyroid gland without nodules meeting criteria for imaging follow-up by size. Normal esophagus. No pathologically enlarged axillary, supraclavicular, mediastinal, or hilar lymph nodes. Lungs/Pleura: The central airways are patent. Evolution of basilar left lower lobe pulmonary infarct with increased consolidation measuring 2.0 x 1.7 cm (9:122) and 1.2 x 1.0 cm (9:131). Presumed small subsegmental atelectasis the inferior most left lung base (11:25). No pneumothorax. No pleural effusion. Upper abdomen: Normal. Musculoskeletal: No acute or abnormal lytic or blastic osseous lesions. Review of the MIP images confirms the above findings. IMPRESSION: 1. No acute pulmonary embolism. Minimal residual eccentric, chronic-appearing nonocclusive thrombus remains within a left lower lobe subsegmental pulmonary artery. 2. Evolution of basilar left  lower lobe pulmonary infarcts. Electronically Signed   By: Agustin Cree M.D.   On: 02/02/2023 13:59       Final Assessment and Plan:   52 year old female presents to the ED complaining of right lower extremity pain this morning.  Recent history of DVT/PE.  No associated chest pain, shortness of breath, palpitations, or other symptoms.  No injury to the leg.  Adherent with Eliquis regimen.  Improvement of PE on CT chest on 5/19.  Evaluated by hematology outpatient with reassuring workup.  Repeat ultrasound today appears to show improving DVT.  Discussed case with attending physician who cosigned this note who also evaluated patient and agrees that DVT appears to be breathing and patient can continue Eliquis regimen and follow-up with vascular.  Vascular follow-up provided.  Patient agreeable with plan.  Strict ED return precautions given, all questions answered, and stable for discharge.   Clinical Impression:  1. Chronic deep vein thrombosis (DVT) of right lower extremity, unspecified vein (HCC)      Discharge           Final Clinical Impression(s) / ED Diagnoses Final diagnoses:  Chronic deep vein thrombosis (DVT) of right lower extremity, unspecified vein [facility])    Rx / DC Orders ED Discharge Orders     None         Richardson Dopp 02/13/23 2055    Vanetta Mulders, MD 02/15/23 2332

## 2023-02-13 NOTE — Progress Notes (Signed)
Lower extremity venous right study completed.  Preliminary results relayed to [place], [place].   See CV Proc for preliminary results report.   Jean Rosenthal, RDMS, RVT

## 2023-02-13 NOTE — ED Triage Notes (Signed)
Right leg swelling which began today.  Pt reports that she felt tightness this am and noted that it was swollen this afternoon.  Pt has hx of PE and she is currently on eliquis (5mg  BID)

## 2023-02-17 ENCOUNTER — Ambulatory Visit
Admission: RE | Admit: 2023-02-17 | Discharge: 2023-02-17 | Disposition: A | Discharge status: Home / Self Care | Payer: BC Managed Care – PPO | Source: Ambulatory Visit | Attending: Hematology | Admitting: Hematology

## 2023-02-17 DIAGNOSIS — I871 Compression of vein: Secondary | ICD-10-CM

## 2023-02-17 HISTORY — PX: IR RADIOLOGIST EVAL & MGMT: IMG5224

## 2023-02-17 NOTE — Consult Note (Signed)
Chief Complaint: New DVT, May-Thurner on CT  Referring Physician(s): Kale,Gautam Kishore  History of Present Illness: Dawn Arnold is a 52 y.o. female presenting as a scheduled consultation to [facility] clinic today, kindly referred by Dr. 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   [name] is here today with her husband for the interview.   She was diagnosed with PE and right leg DVT 01/10/23.   The duplex is positive in the right femoral vein, the popliteal vein, and saphenofemoral junction. Negative in the left lower extremity.   She tells me that before her event, she now recognizes that the right leg was having some pain and swelling "for awhile" that she thought might be MSK related pain.  She denies ever having any symptoms of the left leg, She does report some bilateral meniscus problems. She denies other pain, swelling, varicose veins, skin color changes/induration, wounds, asymmetry.    CT was done with a venous protocol 02/12/2023.  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   She is very active, participating with [employer] training programs.   She is a never smoker.  Does not smoke marijuana. She was taking BC/Nuvaring at the time of the PE/DVT.   She tells me her thrombophilia work up has been negative.   Past Medical History:  Diagnosis Date   Diverticulitis    Ectopic pregnancy    Pregnancy-induced hypertension    Pulmonary embolism (HCC)     Past Surgical History:  Procedure Laterality Date   MENISCUS REPAIR      Allergies: Patient has no known allergies.  Medications: Prior to Admission medications   Medication Sig Start Date End Date Taking? Authorizing Provider  acetaminophen (TYLENOL) 325 MG tablet Take 3 tablets (975 mg total) by mouth every 8 (eight) hours as needed for up to 30 doses. 01/10/23   Terald Sleeper, MD  apixaban (ELIQUIS) 5 MG TABS tablet Take 1 tablet (5 mg total) by mouth 2  (two) times daily. 01/23/23   Briant Cedar, PA-C  ibuprofen (ADVIL) 200 MG tablet Take 200 mg by mouth every 6 (six) hours as needed. Patient not taking: Reported on 01/21/2023    [provider]  polycarbophil (FIBERCON) 625 MG tablet Take 625 mg by mouth daily. gummie    [provider]     No family history on file.  Social History   Socioeconomic History   Marital status: Married    Spouse name: Not on file   Number of children: Not on file   Years of education: Not on file   Highest education level: Not on file  Occupational History   Not on file  Tobacco Use   Smoking status: Never   Smokeless tobacco: Not on file  Substance and Sexual Activity   Alcohol use: Never   Drug use: Never   Sexual activity: Not on file  Other Topics Concern   Not on file  Social History Narrative   Not on file   Social Determinants of Health   Financial Resource Strain: Not on file  Food Insecurity: No Food Insecurity (01/21/2023)   Hunger Vital Sign    Worried About Running Out of Food in the Last Year: Never true    Ran Out of Food in the Last Year: Never true  Transportation Needs: No Transportation Needs (01/21/2023)   PRAPARE - Administrator, Civil Service (Medical): No    Lack of Transportation (Non-Medical):  No  Physical Activity: Not on file  Stress: Not on file  Social Connections: Not on file       Review of Systems: A 12 point ROS discussed and pertinent positives are indicated in the HPI above.  All other systems are negative.  Review of Systems  Vital Signs: BP (!) 150/83   Pulse 60   Wt 67.1 kg Comment: per pt  LMP 02/13/2023 (Exact Date)   SpO2 100% Comment: room air  BMI 23.18 kg/m     Physical Exam General: 52 yo female appearing younger than stated age.  Well-developed, well-nourished.  No distress. HEENT: Atraumatic, normocephalic.  Conjugate gaze, extra-ocular motor intact. No scleral icterus or scleral injection. No lesions  on external ears, nose, lips, or gums.  Oral mucosa moist, pink.  Neck: Symmetric with no goiter enlargement.  Chest/Lungs:  Symmetric chest with inspiration/expiration.  No labored breathing.  Clear to auscultation with no wheezes, rhonchi, or rales.  Heart:  RRR, with no third heart sounds appreciated. No JVD appreciated.  Abdomen:  Soft, NT/ND, with + bowel sounds.   Genito-urinary: Deferred Neurologic: Alert & Oriented to person, place, and time.   Normal affect and insight.  Appropriate questions.  Moving all 4 extremities with gross sensory intact.  Pulse Exam:  No bruit appreciated.  Palpable right popliteal artery & left popliteal artery.  Palpable radial pulses.  Palpable distal pulses. Extremities: No wound.  Warm extremities.  Symmetric.  No varicose veins/telangiectasia/reticular veins. No swelling, induration, or skin changes.      Mallampati Score:     Imaging: VAS [place] LOWER EXTREMITY VENOUS (DVT) (ONLY [facility] & [facility])  Result Date: 02/14/2023  Lower Venous DVT Study Patient Name:  Dawn Arnold  Date of Exam:   02/13/2023 Medical Rec #: [id number]      Accession #:    [id number] Date of Birth: 29-Jul-1971      Patient Gender: F Patient Age:   53 years Exam Location:  [facility] Procedure:      VAS [place] LOWER EXTREMITY VENOUS (DVT) Referring Phys: Vanetta Mulders --------------------------------------------------------------------------------  Indications: New pain and swelling of right knee/calf. History of DVT/PE. On Eliquis.  Risk Factors: Patient reports hematology workup with no relevant findings. Anticoagulation: Eliquis. Comparison Study: 01-10-2023 Prior right lower extremity venous study was                   positive for extensive acute DVT involving the CFV, FV, and                   Pop V Performing Technologist: Jean Rosenthal RDMS, RVT  Examination Guidelines: A complete evaluation includes B-mode imaging, spectral Doppler, color Doppler, and power Doppler as needed of all  accessible portions of each vessel. Bilateral testing is considered an integral part of a complete examination. Limited examinations for reoccurring indications may be performed as noted. The reflux portion of the exam is performed with the patient in reverse Trendelenburg.  +---------+---------------+---------+-----------+---------------+--------------+ RIGHT    CompressibilityPhasicitySpontaneityProperties     Thrombus Aging +---------+---------------+---------+-----------+---------------+--------------+ CFV      Full           Yes      Yes                                      +---------+---------------+---------+-----------+---------------+--------------+ SFJ      Full                                                             +---------+---------------+---------+-----------+---------------+--------------+  FV Prox  Partial        Yes      Yes        Appears        Age                                                        minimally      Indeterminate                                              recanalized                   +---------+---------------+---------+-----------+---------------+--------------+ FV Mid   Partial        Yes      Yes        Appears        Chronic                                                    minimally                                                                 recanalized                   +---------+---------------+---------+-----------+---------------+--------------+ FV DistalPartial        Yes      Yes        Retrograde     Chronic                                                    flow- partially                                                           recanalized                   +---------+---------------+---------+-----------+---------------+--------------+ PFV      Full                                                              +---------+---------------+---------+-----------+---------------+--------------+ POP      None           No       No         Partially  Age                                                        recanalized    Indeterminate                                              proximal                                                                  segment,                                                                  occluded distal                                                           segment with                                                              dilitation                    +---------+---------------+---------+-----------+---------------+--------------+ PTV      Full                                                             +---------+---------------+---------+-----------+---------------+--------------+ PERO     Full                                                             +---------+---------------+---------+-----------+---------------+--------------+ Gastroc  Full                                                             +---------+---------------+---------+-----------+---------------+--------------+   +----+---------------+---------+-----------+----------+--------------+ LEFTCompressibilityPhasicitySpontaneityPropertiesThrombus Aging +----+---------------+---------+-----------+----------+--------------+ CFV Full           Yes  Yes                                 +----+---------------+---------+-----------+----------+--------------+     Summary: RIGHT: - Findings consistent with age indeterminate deep vein thrombosis involving the right femoral vein, and right popliteal vein. Minimal recanalization of the femoral vein with retrograde flow noted in the distal femoral. Heterogenous, echogenic thrombus. Partial recanalization of the proximal popliteal vein, occlusive thrombus of the distal popliteal vein  with dilatation noted. - No cystic structure found in the popliteal fossa.  LEFT: - No evidence of common femoral vein obstruction.  *See table(s) above for measurements and observations. Electronically signed by Sherald Hess MD on 02/14/2023 at 11:31:44 AM.    Final    CT VENOGRAM ABD/PEL  Result Date: 02/12/2023 CLINICAL DATA:  52 year old female with prior DVT and possible May-Thurner EXAM: CT VENOGRAM ABDOMEN AND PELVIS TECHNIQUE: Venographic phase images of the abdomen and pelvis were obtained following the administration of intravenous contrast. Multiplanar reformats and maximum intensity projections were generated. RADIATION DOSE REDUCTION: This exam was performed according to the departmental dose-optimization program which includes automated exposure control, adjustment of the mA and/or kV according to patient size and/or use of iterative reconstruction technique. CONTRAST:  OMNIPAQUE IOHEXOL 350 MG/ML SOLN COMPARISON:  Abdominal CT 06/24/2008, chest CT 02/02/2023, 01/10/2023 FINDINGS: VASCULAR Note that the arterial anatomy not as well characterized given the timing of the contrast bolus Aorta: Unremarkable course, caliber, contour of the abdominal aorta. No dissection, aneurysm, or periaortic fluid. Celiac/SMA: Appears to be a celicomesenteric trunk, without atherosclerotic changes. Renals: - Right: Right renal artery patent. - Left: Left renal artery patent. IMA: Inferior mesenteric artery is patent. Right lower extremity: Unremarkable course, caliber, and contour of the right iliac system. No aneurysm, dissection, or occlusion. Hypogastric artery is patent. Common femoral artery patent. Proximal SFA and profunda femoris patent. Left lower extremity: Unremarkable course, caliber, and contour of the left iliac system. No aneurysm, dissection, or occlusion. Hypogastric artery is patent. Common femoral artery patent. Proximal SFA and profunda femoris patent. Venous Portal venous: The hepatic  veins unremarkable and patent. Splenic vein, superior mesenteric vein, inferior mesenteric vein patent. Main portal vein and the left and right portal vein branches are patent. Systemic venous: Unremarkable hepatic IVC. Supra renal IVC unremarkable. Bilateral renal veins are patent and unremarkable Infrarenal IVC unremarkable. The right common iliac vein is patent, unremarkable course caliber and contour. Internal iliac veins patent. Pelvic veins patent. External iliac vein is patent. The visualized proximal femoral veins unremarkable and patent. 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 The common iliac vein appears patent. Internal iliac vein and pelvic veins are patent. External iliac vein patent and unremarkable caliber. The visualized proximal femoral veins unremarkable. Review of the MIP images confirms the above findings. NON-VASCULAR Lower chest: Involving pulmonary infarction of the left lower lobe, with contraction of the inflammation into a pleural based nodularity. Hepatobiliary: Unremarkable appearance of the liver. Unremarkable gall bladder. Pancreas: Unremarkable. Spleen: Unremarkable. Adrenals/Urinary Tract: - Right adrenal gland: Unremarkable - Left adrenal gland: Unremarkable. - Right kidney: No hydronephrosis, nephrolithiasis, inflammation, or ureteral dilation. No focal lesion. - Left Kidney: No hydronephrosis, nephrolithiasis, inflammation, or ureteral dilation. No focal lesion. - Urinary Bladder: Unremarkable. Stomach/Bowel: - Stomach: Unremarkable. - Small bowel: Unremarkable - Appendix: Appendix is not visualized, however, no inflammatory changes are present adjacent to the  cecum to indicate an appendicitis. - Colon: Unremarkable. Lymphatic: No adenopathy. Mesenteric: No free fluid or air. No mesenteric adenopathy. Reproductive: Early fibroid changes of the uterus, with otherwise unremarkable uterus/adnexa.  Other: Fat containing umbilical hernia. Musculoskeletal: No evidence of acute fracture. No bony canal narrowing. No significant degenerative changes of the hips. IMPRESSION: No acute CT finding. 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 Evolving left lower lobe pulmonary infarction. Signed, Yvone Neu. Miachel Roux, RPVI [facility] Electronically Signed   By: Gilmer Mor D.O.   On: 02/12/2023 14:02   CT Angio Chest PE W and/or Wo Contrast  Result Date: 02/02/2023 CLINICAL DATA:  Recurrent acute dyspnea in the setting of known pulmonary emboli EXAM: CT ANGIOGRAPHY CHEST WITH CONTRAST TECHNIQUE: Multidetector CT imaging of the chest was performed using the standard protocol during bolus administration of intravenous contrast. Multiplanar CT image reconstructions and MIPs were obtained to evaluate the vascular anatomy. RADIATION DOSE REDUCTION: This exam was performed according to the departmental dose-optimization program which includes automated exposure control, adjustment of the mA and/or kV according to patient size and/or use of iterative reconstruction technique. CONTRAST:  75mL OMNIPAQUE IOHEXOL 350 MG/ML SOLN COMPARISON:  CTA chest dated 01/10/2023 FINDINGS: Cardiovascular: The study is high quality for the evaluation of pulmonary embolism. There are no acute filling defects in the central, lobar, segmental or subsegmental pulmonary artery branches to suggest acute pulmonary embolism. Minimal eccentric, chronic-appearing nonocclusive thrombus remains within a left lower lobe subsegmental pulmonary artery (10:242). Great vessels are normal in course and caliber. Normal heart size. No significant pericardial fluid/thickening. Mediastinum/Nodes: Imaged thyroid gland without nodules meeting criteria for imaging follow-up by size. Normal esophagus. No pathologically enlarged  axillary, supraclavicular, mediastinal, or hilar lymph nodes. Lungs/Pleura: The central airways are patent. Evolution of basilar left lower lobe pulmonary infarct with increased consolidation measuring 2.0 x 1.7 cm (9:122) and 1.2 x 1.0 cm (9:131). Presumed small subsegmental atelectasis the inferior most left lung base (11:25). No pneumothorax. No pleural effusion. Upper abdomen: Normal. Musculoskeletal: No acute or abnormal lytic or blastic osseous lesions. Review of the MIP images confirms the above findings. IMPRESSION: 1. No acute pulmonary embolism. Minimal residual eccentric, chronic-appearing nonocclusive thrombus remains within a left lower lobe subsegmental pulmonary artery. 2. Evolution of basilar left lower lobe pulmonary infarcts. Electronically Signed   By: Agustin Cree M.D.   On: 02/02/2023 13:59    Labs:  CBC: Recent Labs    01/10/23 1623 01/21/23 1558 02/02/23 1043  WBC 11.3* 9.8 10.6*  HGB 12.3 14.7 13.6  HCT 37.3 44.3 40.9  PLT 273 297 346    COAGS: No results for input(s): "INR", "APTT" in the last 8760 hours.  BMP: Recent Labs    01/10/23 1623 01/21/23 1558 02/02/23 1043  NA 135 138 136  K 4.4 4.2 4.1  CL 104 101 101  CO2 24 28 23   GLUCOSE 105* 94 105*  BUN 24* 20 13  CALCIUM 8.7* 9.5 9.5  CREATININE 0.99 0.92 0.89  GFRNONAA >60 >60 >60    LIVER FUNCTION TESTS: Recent Labs    01/21/23 1558  BILITOT 0.3  AST 25  ALT 31  ALKPHOS 107  PROT 8.3*  ALBUMIN 4.2    TUMOR MARKERS: No results for input(s): "AFPTM", "CEA", "CA199", "CHROMGRNA" in the last 8760 hours.  Assessment and Plan:  Ms Norell is a 52 yo female with new diagnosis of VTE,  with PE and right lower extremity DVT on 01/10/23.  This is her first event, which is provoked potentially from using birth control.   Currently she is on BID eliquis.   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    Her venous clinical severity score is 0.  [place] Score is  0.   I discussed with her and her husband the diagnosis of VTE, including PE and DVT, the implications of provoked/unprovoked, and the meaning of May-Thurner configuration and how it can sometimes contribute to being a "permissive lesion" or primarily contributing to symptoms.   I did discuss with her the idea of invasive IVUS/angiogram testing for confirming the May-Thurner, and treatment would be stenting, however, I would only offer this if symptoms warranted.   She is asymptomatic, and thus I would not offer any treatment beyond compression stockings for her known VTE.    I answered all of her questions.    We would recommend indefinite low grade compressions stockings, and I think she would benefit from ~28mmHg, either knee-high or thigh-high.  She understands.   Plan: - Continue current care.  - Initiating basal therapy with compression stockings is indicated.  - She is safe to participate with her usual exercise activities, though we discussed possible modification of any high-impact or contact activities because she is on [facility] therapy.  - We are happy to see her on as-needed basis.   Thank you for this interesting consult.  I greatly enjoyed meeting Kodie Biss and look forward to participating in their care.  A copy of this report was sent to the requesting provider on this date.  Electronically Signed: Gilmer Mor 02/17/2023, 8:46 AM   I spent a total of  60 Minutes   in face to face in clinical consultation, greater than 50% of which was counseling/coordinating care for VTE, May-Thurner on CT, asymptomatic.

## 2023-02-20 ENCOUNTER — Encounter: Payer: Self-pay | Admitting: Surgery

## 2023-02-20 ENCOUNTER — Ambulatory Visit: Payer: BC Managed Care – PPO | Admitting: Surgery

## 2023-02-20 VITALS — BP 127/80 | HR 54 | Temp 98.2°F | Resp 14 | Ht 67.0 in | Wt 148.0 lb

## 2023-02-20 DIAGNOSIS — I82411 Acute embolism and thrombosis of right femoral vein: Secondary | ICD-10-CM | POA: Diagnosis not present

## 2023-02-20 NOTE — Progress Notes (Signed)
Vascular and Vein Specialist of [facility]  Patient name: Dawn Arnold MRN: [id number] DOB: 09-06-1971 Sex: female   REQUESTING PROVIDER:    Georga Kaufmann   REASON FOR CONSULT:    May-thurner  HISTORY OF PRESENT ILLNESS:   Dawn Arnold is a 52 y.o. 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  She was diagnosed with a PE and right leg DVT on 01/10/2023.  Thrombus was in the right femoral vein popliteal vein and saphenofemoral junction.  She had nonocclusive thrombus in the right common femoral vein.  She was started on Eliquis and referred to hematology.  It was thought that the etiology of her DVT was estrogen-based birth control.  Her Nuvaring has been removed.  6 Months of anticoagulation has been recommended.  Full hypercoagulable workup is in process and is reportedly negative.  She developed some right calf tenderness yesterday for which she is getting relief with an Ace wrap.  Otherwise she does not have right leg symptoms.  She denies any swelling.  Patient is very active.  She denies any prior history of DVT.  She denies any family history of DVT she has not had any issues with the left leg.  PAST MEDICAL HISTORY    Past Medical History:  Diagnosis Date   Diverticulitis    Ectopic pregnancy    Pregnancy-induced hypertension    Pulmonary embolism (HCC)      FAMILY HISTORY   History reviewed. No pertinent family history.  SOCIAL HISTORY:   Social History   Socioeconomic History   Marital status: Married    Spouse name: Not on file   Number of children: Not on file   Years of education: Not on file   Highest education level: Not on file  Occupational History   Not on file  Tobacco Use   Smoking status: Never   Smokeless tobacco: Not on file  Substance and Sexual Activity   Alcohol use: Never   Drug use: Never   Sexual activity: Not on file  Other Topics Concern   Not on file  Social History  Narrative   Not on file   Social Determinants of Health   Financial Resource Strain: Not on file  Food Insecurity: No Food Insecurity (01/21/2023)   Hunger Vital Sign    Worried About Running Out of Food in the Last Year: Never true    Ran Out of Food in the Last Year: Never true  Transportation Needs: No Transportation Needs (01/21/2023)   PRAPARE - Administrator, Civil Service (Medical): No    Lack of Transportation (Non-Medical): No  Physical Activity: Not on file  Stress: Not on file  Social Connections: Not on file  Intimate Partner Violence: Not At Risk (01/21/2023)   Humiliation, Afraid, Rape, and Kick questionnaire    Fear of Current or Ex-Partner: No    Emotionally Abused: No    Physically Abused: No    Sexually Abused: No    ALLERGIES:    No Known Allergies  CURRENT MEDICATIONS:    Current Outpatient Medications  Medication Sig Dispense Refill   acetaminophen (TYLENOL) 325 MG tablet Take 3 tablets (975 mg total) by mouth every 8 (eight) hours as needed for up to 30 doses. 30 tablet 0   apixaban (ELIQUIS) 5 MG TABS tablet Take 1 tablet (5 mg total) by mouth 2 (two) times daily. 60 tablet 6   polycarbophil (FIBERCON) 625 MG tablet Take 625 mg by mouth daily.  gummie     ibuprofen (ADVIL) 200 MG tablet Take 200 mg by mouth every 6 (six) hours as needed. (Patient not taking: Reported on 01/21/2023)     No current facility-administered medications for this visit.    REVIEW OF SYSTEMS:   [X]  denotes positive finding, [ ]  denotes negative finding Cardiac  Comments:  Chest pain or chest pressure:    Shortness of breath upon exertion:    Short of breath when lying flat:    Irregular heart rhythm:        Vascular    Pain in calf, thigh, or hip brought on by ambulation:    Pain in feet at night that wakes you up from your sleep:     Blood clot in your veins:    Leg swelling:         Pulmonary    Oxygen at home:    Productive cough:     Wheezing:          Neurologic    Sudden weakness in arms or legs:     Sudden numbness in arms or legs:     Sudden onset of difficulty speaking or slurred speech:    Temporary loss of vision in one eye:     Problems with dizziness:         Gastrointestinal    Blood in stool:      Vomited blood:         Genitourinary    Burning when urinating:     Blood in urine:        Psychiatric    Major depression:         Hematologic    Bleeding problems:    Problems with blood clotting too easily:        Skin    Rashes or ulcers:        Constitutional    Fever or chills:     PHYSICAL EXAM:   Vitals:   02/20/23 0909  BP: 127/80  Pulse: (!) 54  Resp: 14  Temp: 98.2 F (36.8 C)  TempSrc: Temporal  Weight: 148 lb (67.1 kg)  Height: 5\' 7"  (1.702 m)    GENERAL: The patient is a well-nourished female, in no acute distress. The vital signs are documented above. CARDIAC: There is a regular rate and rhythm.  VASCULAR: No significant leg swelling PULMONARY: Nonlabored respirations MUSCULOSKELETAL: There are no major deformities or cyanosis. NEUROLOGIC: No focal weakness or paresthesias are detected. SKIN: There are no ulcers or rashes noted. PSYCHIATRIC: The patient has a normal affect.  STUDIES:   I have reviewed her CT venogram with the following findings: No acute CT finding.   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   Evolving left lower lobe pulmonary infarction.  ASSESSMENT and PLAN   Right leg DVT: This was only partially occlusive in the common femoral vein and therefore no intervention has been recommended.  In addition, she is not having any significant right leg swelling.  I suspect that with time the DVT will resolve for recanalize.  Currently she is scheduled to have 6 months of anticoagulation.  I did recommend that she take 81 mg aspirin once she stops her anticoagulation.  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  I discussed that I would not recommend intervention unless she develops a DVT or significant leg swelling.  I have recommended that she wear compression socks on  long trips and when she will be on her feet for prolonged periods of time.   Charlena Cross, MD, FACS [facility] and Vein Specialists of [facility] [phone] Pager [phone]

## 2023-05-12 ENCOUNTER — Encounter: Payer: Self-pay | Admitting: Hematology

## 2023-06-23 ENCOUNTER — Other Ambulatory Visit: Payer: Self-pay

## 2023-06-23 DIAGNOSIS — I824Y1 Acute embolism and thrombosis of unspecified deep veins of right proximal lower extremity: Secondary | ICD-10-CM

## 2023-06-24 ENCOUNTER — Inpatient Hospital Stay: Payer: BC Managed Care – PPO | Attending: Hematology

## 2023-06-24 ENCOUNTER — Inpatient Hospital Stay: Payer: BC Managed Care – PPO | Admitting: Hematology

## 2023-06-24 VITALS — BP 108/66 | HR 67 | Temp 98.1°F | Resp 20 | Wt 152.2 lb

## 2023-06-24 DIAGNOSIS — I82531 Chronic embolism and thrombosis of right popliteal vein: Secondary | ICD-10-CM | POA: Insufficient documentation

## 2023-06-24 DIAGNOSIS — Z86711 Personal history of pulmonary embolism: Secondary | ICD-10-CM | POA: Diagnosis not present

## 2023-06-24 DIAGNOSIS — Z7982 Long term (current) use of aspirin: Secondary | ICD-10-CM | POA: Diagnosis not present

## 2023-06-24 DIAGNOSIS — I824Y1 Acute embolism and thrombosis of unspecified deep veins of right proximal lower extremity: Secondary | ICD-10-CM

## 2023-06-24 DIAGNOSIS — I82511 Chronic embolism and thrombosis of right femoral vein: Secondary | ICD-10-CM | POA: Insufficient documentation

## 2023-06-24 LAB — CBC WITH DIFFERENTIAL (CANCER CENTER ONLY)
Abs Immature Granulocytes: 0.04 10*3/uL (ref 0.00–0.07)
Basophils Absolute: 0.2 10*3/uL — ABNORMAL HIGH (ref 0.0–0.1)
Basophils Relative: 1 %
Eosinophils Absolute: 0.6 10*3/uL — ABNORMAL HIGH (ref 0.0–0.5)
Eosinophils Relative: 6 %
HCT: 37.7 % (ref 36.0–46.0)
Hemoglobin: 12.6 g/dL (ref 12.0–15.0)
Immature Granulocytes: 0 %
Lymphocytes Relative: 33 %
Lymphs Abs: 3.7 10*3/uL (ref 0.7–4.0)
MCH: 32.2 pg (ref 26.0–34.0)
MCHC: 33.4 g/dL (ref 30.0–36.0)
MCV: 96.4 fL (ref 80.0–100.0)
Monocytes Absolute: 1 10*3/uL (ref 0.1–1.0)
Monocytes Relative: 9 %
Neutro Abs: 5.6 10*3/uL (ref 1.7–7.7)
Neutrophils Relative %: 51 %
Platelet Count: 259 10*3/uL (ref 150–400)
RBC: 3.91 MIL/uL (ref 3.87–5.11)
RDW: 12.5 % (ref 11.5–15.5)
WBC Count: 11 10*3/uL — ABNORMAL HIGH (ref 4.0–10.5)
nRBC: 0 % (ref 0.0–0.2)

## 2023-06-24 LAB — CMP (CANCER CENTER ONLY)
ALT: 10 U/L (ref 0–44)
AST: 17 U/L (ref 15–41)
Albumin: 3.8 g/dL (ref 3.5–5.0)
Alkaline Phosphatase: 83 U/L (ref 38–126)
Anion gap: 4 — ABNORMAL LOW (ref 5–15)
BUN: 18 mg/dL (ref 6–20)
CO2: 29 mmol/L (ref 22–32)
Calcium: 9.5 mg/dL (ref 8.9–10.3)
Chloride: 105 mmol/L (ref 98–111)
Creatinine: 1.04 mg/dL — ABNORMAL HIGH (ref 0.44–1.00)
GFR, Estimated: >60 mL/min (ref >60)
Glucose, Bld: 88 mg/dL (ref 70–99)
Potassium: 4.1 mmol/L (ref 3.5–5.1)
Sodium: 138 mmol/L (ref 135–145)
Total Bilirubin: 0.3 mg/dL (ref 0.3–1.2)
Total Protein: 6.6 g/dL (ref 6.5–8.1)

## 2023-06-24 NOTE — Progress Notes (Signed)
HEMATOLOGY/ONCOLOGY CONSULTATION NOTE  Date of Service: 06/24/2023  Patient Care Team: Patient, No Pcp Per as PCP - General (General Practice)  CHIEF COMPLAINTS/PURPOSE OF CONSULTATION:  Evaluation and management of acute DVT of proximal vein of RLE  HISTORY OF PRESENTING ILLNESS:  Dawn Arnold is a wonderful 52 y.o. female who is here for evaluation and management of acute DVT of proximal vein of RLE.   CTA chest showed Pulmonary emboli identified scattered in the lung bases. In particular significant clot burden in the left lower lobe. Peripheral wedge-shaped area of opacity and ground-glass dependently along the left lower lobe, and tiny left pleural effusion. Doppler US showed occlusive DVT in the RIGHT lower extremity at the saphenofemoral junction, in the femoral vein, and in the popliteal vein. There is nonocclusive thrombus in the right common femoral vein.  She was seen by Mariane Baumgarten, PA on 01/21/2023 for RLE DVT and pulmonary emboli. Patient complained of some tightness with deep inspirations, occasional pain in right thigh and behind knee.  Patient was seen by Dr. 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 Full hypercoagulable workup was reportedly negative. She was scheduled to have 6 months of anticoagulation and to take 81 MG aspirin once she stopped anticoagulation.   Today, she is accompanied by her husband. Patient does have obvious risk factors for her blood clot including Nuvaring and estrogen-based birth control. Her Nuvaring was removed in May and she denies any recent estrogen-based exposures. Patient has been on blood thinners since being diagnosed with PE.   She reports that she previously endorsed a sense of tightness in her legs and limited ability to bend/straighten at the knee when squatting or using the restroom. The symptoms are not present at rest. Her knee issues and leg swelling has improved over the last month. She does notice more LE swelling when  she is not exercising. Patient sometimes wears compression socks when exercising.   Patient denies any new SOB, chest pain, leg swelling, back pain, new lumps/bumps, or other new symptoms over the last 4 months. She denies being on any new medications.  Her breathing has returned to baseline and she denies any SOB or limitations with activity. Patient is not a smoker. She denies any upcoming planned travels.  She reports that vascular surgeon did not feel that there was a role for Prophylactic stenting.   Patient reports having an abnormal fecal occult testing result in June with PCP. She is planning to have a colonoscopy with her PCP once she completes her course of blood thinners.   She was previously on progesterone-based IUD and notes trying Mirena IUD previously. Patient was seen by OB/GYN to discuss other contraceptive options. There were considerations of a copper IUD.   Patient does experience heavy menstrual cycles at this time. She notes that she was not having any periods during her fecal occult testing.   Mammogram in June showed findings of cyst, but there were no concerning findings. Patient is planning to have a colonoscopy soon. Patient has a scheduled dental cleaning at the end of October.  MEDICAL HISTORY:  Past Medical History:  Diagnosis Date   Diverticulitis    Ectopic pregnancy    Pregnancy-induced hypertension    Pulmonary embolism (HCC)     SURGICAL HISTORY: Past Surgical History:  Procedure Laterality Date   IR RADIOLOGIST EVAL & MGMT  02/17/2023   MENISCUS REPAIR      SOCIAL HISTORY: Social History   Socioeconomic History   Marital  status: Married    Spouse name: Not on file   Number of children: Not on file   Years of education: Not on file   Highest education level: Not on file  Occupational History   Not on file  Tobacco Use   Smoking status: Never   Smokeless tobacco: Not on file  Substance and Sexual Activity   Alcohol use: Never   Drug  use: Never   Sexual activity: Not on file  Other Topics Concern   Not on file  Social History Narrative   Not on file   Social Determinants of Health   Financial Resource Strain: Not on file  Food Insecurity: Low Risk  (02/24/2023)   Received from [facility]   Hunger Vital Sign    Worried About Running Out of Food in the Last Year: Never true    Ran Out of Food in the Last Year: Never true  Transportation Needs: Not on file (02/24/2023)  Physical Activity: Not on file  Stress: Not on file  Social Connections: Not on file  Intimate Partner Violence: Not At Risk (01/21/2023)   Humiliation, Afraid, Rape, and Kick questionnaire    Fear of Current or Ex-Partner: No    Emotionally Abused: No    Physically Abused: No    Sexually Abused: No    FAMILY HISTORY: No family history on file.  ALLERGIES:  has No Known Allergies.  MEDICATIONS:  Current Outpatient Medications  Medication Sig Dispense Refill   acetaminophen (TYLENOL) 325 MG tablet Take 3 tablets (975 mg total) by mouth every 8 (eight) hours as needed for up to 30 doses. 30 tablet 0   apixaban (ELIQUIS) 5 MG TABS tablet Take 1 tablet (5 mg total) by mouth 2 (two) times daily. 60 tablet 6   ibuprofen (ADVIL) 200 MG tablet Take 200 mg by mouth every 6 (six) hours as needed. (Patient not taking: Reported on 01/21/2023)     polycarbophil (FIBERCON) 625 MG tablet Take 625 mg by mouth daily. gummie     No current facility-administered medications for this visit.    REVIEW OF SYSTEMS:    10 Point review of Systems was done is negative except as noted above.  PHYSICAL EXAMINATION: ECOG PERFORMANCE STATUS: 1 - Symptomatic but completely ambulatory  . Vitals:   06/24/23 1435  BP: 108/66  Pulse: 67  Resp: 20  Temp: 98.1 F (36.7 C)  SpO2: 99%   Filed Weights   06/24/23 1435  Weight: 152 lb 3.2 oz (69 kg)   .Body mass index is 23.84 kg/m.  GENERAL:alert, in no acute distress and comfortable SKIN: no acute rashes, no  significant lesions EYES: conjunctiva are pink and non-injected, sclera anicteric OROPHARYNX: MMM, no exudates, no oropharyngeal erythema or ulceration NECK: supple, no JVD LYMPH:  no palpable lymphadenopathy in the cervical, axillary or inguinal regions LUNGS: clear to auscultation b/l with normal respiratory effort HEART: regular rate & rhythm ABDOMEN:  normoactive bowel sounds , non tender, not distended. Extremity: no pedal edema PSYCH: alert & oriented x 3 with fluent speech NEURO: no focal motor/sensory deficits  LABORATORY DATA:  I have reviewed the data as listed  .    Latest Ref Rng & Units 02/02/2023   10:43 AM 01/21/2023    3:58 PM 01/10/2023    4:23 PM  CBC  WBC 4.0 - 10.5 K/uL 10.6  9.8  11.3   Hemoglobin 12.0 - 15.0 g/dL 96.0  45.4  09.8   Hematocrit 36.0 - 46.0 %  40.9  44.3  37.3   Platelets 150 - 400 K/uL 346  297  273     .    Latest Ref Rng & Units 02/02/2023   10:43 AM 01/21/2023    3:58 PM 01/10/2023    4:23 PM  CMP  Glucose 70 - 99 mg/dL 161  94  096   BUN 6 - 20 mg/dL 13  20  24    Creatinine 0.44 - 1.00 mg/dL 0.45  4.09  8.11   Sodium 135 - 145 mmol/L 136  138  135   Potassium 3.5 - 5.1 mmol/L 4.1  4.2  4.4   Chloride 98 - 111 mmol/L 101  101  104   CO2 22 - 32 mmol/L 23  28  24    Calcium 8.9 - 10.3 mg/dL 9.5  9.5  8.7   Total Protein 6.5 - 8.1 g/dL  8.3    Total Bilirubin 0.3 - 1.2 mg/dL  0.3    Alkaline Phos 38 - 126 U/L  107    AST 15 - 41 U/L  25    ALT 0 - 44 U/L  31       RADIOGRAPHIC STUDIES: I have personally reviewed the radiological images as listed and agreed with the findings in the report. No results found.  ASSESSMENT & PLAN:  52 y.o. female with  Acute DVT of proximal vein of RLE   PLAN:  -Discussed lab results on 06/24/23 in detail with patient. CBC showed WBC of 11.0K, hemoglobin of 12.6, and platelets of 259K. -hypercoagulable panel showed no obvious findings -CT venogram showed May-thurner physiology in left common iliac  vein and her right side was normal. -discussed results of ultrasounds from April and May -it was thought that her previous estrogen-based birth control was the obvious transient reason for her blood clot -discussed that in general, if the blood clot dissolves completely and there is no ongoing risk factor, blood thinners would be continued for 6 months -given May-thurner physiology and extensive blood clots it would be reasonable for her to be on baby Aspirin once daily for 1-2 years once she is off of blood thinners. Her risk profile is not significant enough for long term use of full dose blood thinners. -reasonable to proceed with [place] to determine baseline in case any new symptoms develop. Discussed that significant blood clots may cause Post-thrombotic syndrome -recommend patient to use compression socks regularly -will order [place] for evaluation -will make final decision on blood thinners after [place] -discussed that if she has any signs of a blood clot again while she is off of blood thinners, there may be a role for lifelong blood thinners -okay to be on blood thinners for regular dental cleanings. Also okay to hold dental cleaning appointment until she is off of blood thinners if she chooses to.  -discussed details of Cologuard testing for educational purposes -lower extremity discomfort may be due to muscle stiffness from engaging certain muscles for a while -educated patient that a copper IUD may cause more cramping and does not slow periods  -okay to use Mirena IUD without an increased risk of repeat clots.  -recommend patient to stay UTD with age-appropriate cancer screenings, including mammogram and colonoscopy. She is planning to have a colonoscopy soon.  -advised patient to avoid sitting for long periods that might cause bending at the hips for long periods to reduce the risk of blood clots in the area -advised patient that in the case of any upcoming travels,  there would be recommendations  of using compression socks while traveling, limiting caffeine/alcohol intake, staying well hydrated, and taking frequent breaks when traveling, to reduce the risk of a blood clot. Patient denies any upcoming travels at this time.  -answered all of patient's and her husband's questions in detail, including those regarding potentially re-assessing blood clots with imaging  FOLLOW-UP: -[place] lower extremity venous rt lower extremity to evaluate for resolution of DVT in 1 week -Phone visit with Dr Candise Che in 2 weeks  The total time spent in the appointment was 30 minutes* .  All of the patient's questions were answered with apparent satisfaction. The patient knows to call the clinic with any problems, questions or concerns.   Wyvonnia Lora MD MS AAHIVMS [facility] Hematology/Oncology Physician [facility]  .*Total Encounter Time as defined by the Centers for Medicare and Medicaid Services includes, in addition to the face-to-face time of a patient visit (documented in the note above) non-face-to-face time: obtaining and reviewing outside history, ordering and reviewing medications, tests or procedures, care coordination (communications with other health care professionals or caregivers) and documentation in the medical record.    I,Mitra Faeizi,acting as a Neurosurgeon for Wyvonnia Lora, MD.,have documented all relevant documentation on the behalf of Wyvonnia Lora, MD,as directed by  Wyvonnia Lora, MD while in the presence of Wyvonnia Lora, MD.  .I have reviewed the above documentation for accuracy and completeness, and I agree with the above. Johney Maine MD

## 2023-06-27 ENCOUNTER — Ambulatory Visit (HOSPITAL_COMMUNITY)
Admission: RE | Admit: 2023-06-27 | Discharge: 2023-06-27 | Disposition: A | Discharge status: Home / Self Care | Payer: BC Managed Care – PPO | Source: Ambulatory Visit | Attending: Hematology | Admitting: Hematology

## 2023-06-27 DIAGNOSIS — I824Y1 Acute embolism and thrombosis of unspecified deep veins of right proximal lower extremity: Secondary | ICD-10-CM | POA: Diagnosis present

## 2023-06-27 NOTE — Progress Notes (Addendum)
Lower extremity venous duplex completed. Please see CV Procedures for preliminary results.  Initial findings reported to Vanessa Kick, Charity fundraiser.  Shona Simpson, RVT 06/27/23 10:25 AM

## 2023-07-07 ENCOUNTER — Inpatient Hospital Stay (HOSPITAL_BASED_OUTPATIENT_CLINIC_OR_DEPARTMENT_OTHER): Payer: BC Managed Care – PPO | Admitting: Hematology

## 2023-07-07 DIAGNOSIS — I82511 Chronic embolism and thrombosis of right femoral vein: Secondary | ICD-10-CM | POA: Diagnosis not present

## 2023-07-07 DIAGNOSIS — I824Y1 Acute embolism and thrombosis of unspecified deep veins of right proximal lower extremity: Secondary | ICD-10-CM | POA: Diagnosis not present

## 2023-07-07 NOTE — Progress Notes (Signed)
HEMATOLOGY/ONCOLOGY TELE-MED VISIT NOTE  Date of Service: 07/07/2023  Patient Care Team: Everlean Cherry, MD as PCP - General (Family Medicine)  CHIEF COMPLAINTS/PURPOSE OF CONSULTATION:  Evaluation and management of acute DVT of proximal vein of RLE  HISTORY OF PRESENTING ILLNESS:  Dawn Arnold is a wonderful 52 y.o. female who is here for evaluation and management of acute DVT of proximal vein of RLE.   CTA chest showed Pulmonary emboli identified scattered in the lung bases. In particular significant clot burden in the left lower lobe. Peripheral wedge-shaped area of opacity and ground-glass dependently along the left lower lobe, and tiny left pleural effusion. Doppler US showed occlusive DVT in the RIGHT lower extremity at the saphenofemoral junction, in the femoral vein, and in the popliteal vein. There is nonocclusive thrombus in the right common femoral vein.  She was seen by Mariane Baumgarten, PA on 01/21/2023 for RLE DVT and pulmonary emboli. Patient complained of some tightness with deep inspirations, occasional pain in right thigh and behind knee.  Patient was seen by Dr. 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 Full hypercoagulable workup was reportedly negative. She was scheduled to have 6 months of anticoagulation and to take 81 MG aspirin once she stopped anticoagulation.   Today, she is accompanied by her husband. Patient does have obvious risk factors for her blood clot including Nuvaring and estrogen-based birth control. Her Nuvaring was removed in May and she denies any recent estrogen-based exposures. Patient has been on blood thinners since being diagnosed with PE.   She reports that she previously endorsed a sense of tightness in her legs and limited ability to bend/straighten at the knee when squatting or using the restroom. The symptoms are not present at rest. Her knee issues and leg swelling has improved over the last month. She does notice more LE swelling  when she is not exercising. Patient sometimes wears compression socks when exercising.   Patient denies any new SOB, chest pain, leg swelling, back pain, new lumps/bumps, or other new symptoms over the last 4 months. She denies being on any new medications.  Her breathing has returned to baseline and she denies any SOB or limitations with activity. Patient is not a smoker. She denies any upcoming planned travels.  She reports that vascular surgeon did not feel that there was a role for Prophylactic stenting.   Patient reports having an abnormal fecal occult testing result in June with PCP. She is planning to have a colonoscopy with her PCP once she completes her course of blood thinners.   She was previously on progesterone-based IUD and notes trying Mirena IUD previously. Patient was seen by OB/GYN to discuss other contraceptive options. There were considerations of a copper IUD.   Patient does experience heavy menstrual cycles at this time. She notes that she was not having any periods during her fecal occult testing.   Mammogram in June showed findings of cyst, but there were no concerning findings. Patient is planning to have a colonoscopy soon. Patient has a scheduled dental cleaning at the end of October.  INTERVAL HISTORY: Dawn Arnold is a wonderful 52 y.o. female who is connected via phone continued evaluation and management of acute DVT of proximal vein of RLE. Patient was last seen by me on 06/24/2023 and she complained of a sense of tightness in her legs and limited ability to bend/straighten.  .I connected with Dawn Arnold on 07/07/2023 at  8:40 AM EDT by telephone visit and verified  that I am speaking with the correct person using two identifiers.   Patient notes she has been doing well overall since our last visit. Patient still notices a sense of tightness in her legs, but denies any pain. She only notices the tightness in her legs with certain movements, which lasts fro  around 10-15 minutes.   She denies any new infection issues, fever, chills, night sweats, abdominal pain, chest pain, back pain, or leg swelling.   Discussed the ultrasound results from 06/27/2023 in detail with the patient.   I discussed the limitations, risks, security and privacy concerns of performing an evaluation and management service by telemedicine and the availability of in-person appointments. I also discussed with the patient that there may be a patient responsible charge related to this service. The patient expressed understanding and agreed to proceed.   Other persons participating in the visit and their role in the encounter: None   Patient's location: Home  Provider's location: [facility]   Chief Complaint: DVT plus Ultrasound results.     MEDICAL HISTORY:  Past Medical History:  Diagnosis Date   Diverticulitis    Ectopic pregnancy    Pregnancy-induced hypertension    Pulmonary embolism (HCC)     SURGICAL HISTORY: Past Surgical History:  Procedure Laterality Date   IR RADIOLOGIST EVAL & MGMT  02/17/2023   MENISCUS REPAIR      SOCIAL HISTORY: Social History   Socioeconomic History   Marital status: Married    Spouse name: Not on file   Number of children: Not on file   Years of education: Not on file   Highest education level: Not on file  Occupational History   Not on file  Tobacco Use   Smoking status: Never   Smokeless tobacco: Not on file  Substance and Sexual Activity   Alcohol use: Never   Drug use: Never   Sexual activity: Not on file  Other Topics Concern   Not on file  Social History Narrative   Not on file   Social Determinants of Health   Financial Resource Strain: Not on file  Food Insecurity: Low Risk  (02/24/2023)   Received from [facility]   Hunger Vital Sign    Worried About Running Out of Food in the Last Year: Never true    Ran Out of Food in the Last Year: Never true  Transportation Needs: Not on file (02/24/2023)  Physical  Activity: Not on file  Stress: Not on file  Social Connections: Not on file  Intimate Partner Violence: Not At Risk (01/21/2023)   Humiliation, Afraid, Rape, and Kick questionnaire    Fear of Current or Ex-Partner: No    Emotionally Abused: No    Physically Abused: No    Sexually Abused: No    FAMILY HISTORY: No family history on file.  ALLERGIES:  has No Known Allergies.  MEDICATIONS:  Current Outpatient Medications  Medication Sig Dispense Refill   acetaminophen (TYLENOL) 325 MG tablet Take 3 tablets (975 mg total) by mouth every 8 (eight) hours as needed for up to 30 doses. 30 tablet 0   apixaban (ELIQUIS) 5 MG TABS tablet Take 1 tablet (5 mg total) by mouth 2 (two) times daily. 60 tablet 6   ibuprofen (ADVIL) 200 MG tablet Take 200 mg by mouth every 6 (six) hours as needed. (Patient not taking: Reported on 01/21/2023)     polycarbophil (FIBERCON) 625 MG tablet Take 625 mg by mouth daily. gummie     No current  facility-administered medications for this visit.    REVIEW OF SYSTEMS:    10 Point review of Systems was done is negative except as noted above.  PHYSICAL EXAMINATION: TELE-MED VISIT  LABORATORY DATA:  I have reviewed the data as listed  .    Latest Ref Rng & Units 06/24/2023    2:00 PM 02/02/2023   10:43 AM 01/21/2023    3:58 PM  CBC  WBC 4.0 - 10.5 K/uL 11.0  10.6  9.8   Hemoglobin 12.0 - 15.0 g/dL 16.1  09.6  04.5   Hematocrit 36.0 - 46.0 % 37.7  40.9  44.3   Platelets 150 - 400 K/uL 259  346  297     .    Latest Ref Rng & Units 06/24/2023    2:00 PM 02/02/2023   10:43 AM 01/21/2023    3:58 PM  CMP  Glucose 70 - 99 mg/dL 88  409  94   BUN 6 - 20 mg/dL 18  13  20    Creatinine 0.44 - 1.00 mg/dL 8.11  9.14  7.82   Sodium 135 - 145 mmol/L 138  136  138   Potassium 3.5 - 5.1 mmol/L 4.1  4.1  4.2   Chloride 98 - 111 mmol/L 105  101  101   CO2 22 - 32 mmol/L 29  23  28    Calcium 8.9 - 10.3 mg/dL 9.5  9.5  9.5   Total Protein 6.5 - 8.1 g/dL 6.6   8.3   Total  Bilirubin 0.3 - 1.2 mg/dL 0.3   0.3   Alkaline Phos 38 - 126 U/L 83   107   AST 15 - 41 U/L 17   25   ALT 0 - 44 U/L 10   31      RADIOGRAPHIC STUDIES: I have personally reviewed the radiological images as listed and agreed with the findings in the report. VAS [place] LOWER EXTREMITY VENOUS (DVT)  Result Date: 06/27/2023  Lower Venous DVT Study Patient Name:  MAKAILEE STANAWAY  Date of Exam:   06/27/2023 Medical Rec #: [id number]      Accession #:    [id number] Date of Birth: 06/06/1971      Patient Gender: F Patient Age:   53 years Exam Location:  [facility] Procedure:      VAS [place] LOWER EXTREMITY VENOUS (DVT) Referring Phys: Wyvonnia Lora --------------------------------------------------------------------------------  Indications: Hx of DVT.  Risk Factors: Past pregnancy. Comparison Study: No changes seen since previous exam 02/13/23 Performing Technologist: Shona Simpson  Examination Guidelines: A complete evaluation includes B-mode imaging, spectral Doppler, color Doppler, and power Doppler as needed of all accessible portions of each vessel. Bilateral testing is considered an integral part of a complete examination. Limited examinations for reoccurring indications may be performed as noted. The reflux portion of the exam is performed with the patient in reverse Trendelenburg.  +---------+---------------+---------+-----------+----------+--------------+ RIGHT    CompressibilityPhasicitySpontaneityPropertiesThrombus Aging +---------+---------------+---------+-----------+----------+--------------+ CFV      Full           Yes      Yes                                 +---------+---------------+---------+-----------+----------+--------------+ SFJ      Full                                                        +---------+---------------+---------+-----------+----------+--------------+  FV Prox  Partial                                      Chronic         +---------+---------------+---------+-----------+----------+--------------+ FV Mid   Partial                                      Chronic        +---------+---------------+---------+-----------+----------+--------------+ FV DistalPartial                                      Chronic        +---------+---------------+---------+-----------+----------+--------------+ PFV      Full                                                        +---------+---------------+---------+-----------+----------+--------------+ POP      Partial        Yes      Yes                  Chronic        +---------+---------------+---------+-----------+----------+--------------+ PTV      Full                                                        +---------+---------------+---------+-----------+----------+--------------+ PERO     Full                                                        +---------+---------------+---------+-----------+----------+--------------+ Gastroc  Full                                                        +---------+---------------+---------+-----------+----------+--------------+   +----+---------------+---------+-----------+----------+--------------+ LEFTCompressibilityPhasicitySpontaneityPropertiesThrombus Aging +----+---------------+---------+-----------+----------+--------------+ CFV Full           Yes      Yes                                 +----+---------------+---------+-----------+----------+--------------+    Summary: RIGHT: - Findings consistent with chronic deep vein thrombosis involving the right femoral vein, and right popliteal vein.  - No cystic structure found in the popliteal fossa.  LEFT: - No evidence of common femoral vein obstruction.   *See table(s) above for measurements and observations. Electronically signed by Carolynn Sayers on 06/27/2023 at 2:34:28 PM.    Final     ASSESSMENT & PLAN:  52 y.o. female with  Acute DVT of proximal  vein of RLE   PLAN: -Discussed Ultrasound results from 06/27/2023 in detail with the  patient. Showed chronic Deep Vein Thrombosis involving the right femoral vein and right popliteal vein. No cystic structure.  -Discussed the risk factors of DVT and how to avoid it.  -recommend to wear compression socks.  -Discussed with the patient that she can continue the aspirin or change her medication to preventative dose of Xarleto.  -Answered all of patient's questions.  -Patient notes she wants a lab workup for D-dimer. Patient will come to the oncology center for this lab workup. -Labs within 1 week. -Preventative dose of Eliquis if D-dimer is elevated. If D-Dimer is normal then patient will take baby aspirin. If patient starts baby aspirin, we will get next lab in 6-8 weeks and if D-dimer is elevated in 6-8 weeks then patient will start preventative dose of Eliquis.  FOLLOW-UP: Labs within 1 week Labs in 7 weeks with phone visit the day after labs*  The total time spent in the appointment was 20 minutes* .  All of the patient's questions were answered with apparent satisfaction. The patient knows to call the clinic with any problems, questions or concerns.   Wyvonnia Lora MD MS AAHIVMS [facility] Hematology/Oncology [facility]  .*Total Encounter Time as defined by the Centers for Medicare and Medicaid Services includes, in addition to the face-to-face time of a patient visit (documented in the note above) non-face-to-face time: obtaining and reviewing outside history, ordering and reviewing medications, tests or procedures, care coordination (communications with other health care professionals or caregivers) and documentation in the medical record.   I,Param Shah,acting as a Neurosurgeon for Wyvonnia Lora, MD.,have documented all relevant documentation on the behalf of Wyvonnia Lora, MD,as directed by  Wyvonnia Lora, MD while in the presence of Wyvonnia Lora, MD.  .I have reviewed the  above documentation for accuracy and completeness, and I agree with the above. Johney Maine MD

## 2023-07-14 ENCOUNTER — Other Ambulatory Visit: Payer: Self-pay

## 2023-07-14 DIAGNOSIS — I824Y1 Acute embolism and thrombosis of unspecified deep veins of right proximal lower extremity: Secondary | ICD-10-CM

## 2023-07-16 ENCOUNTER — Inpatient Hospital Stay: Payer: BC Managed Care – PPO

## 2023-07-16 DIAGNOSIS — I82511 Chronic embolism and thrombosis of right femoral vein: Secondary | ICD-10-CM | POA: Diagnosis not present

## 2023-07-16 DIAGNOSIS — I824Y1 Acute embolism and thrombosis of unspecified deep veins of right proximal lower extremity: Secondary | ICD-10-CM

## 2023-07-16 LAB — CBC WITH DIFFERENTIAL (CANCER CENTER ONLY)
Abs Immature Granulocytes: 0.04 10*3/uL (ref 0.00–0.07)
Basophils Absolute: 0.1 10*3/uL (ref 0.0–0.1)
Basophils Relative: 1 %
Eosinophils Absolute: 0.1 10*3/uL (ref 0.0–0.5)
Eosinophils Relative: 1 %
HCT: 38 % (ref 36.0–46.0)
Hemoglobin: 12.9 g/dL (ref 12.0–15.0)
Immature Granulocytes: 0 %
Lymphocytes Relative: 16 %
Lymphs Abs: 2.5 10*3/uL (ref 0.7–4.0)
MCH: 32.4 pg (ref 26.0–34.0)
MCHC: 33.9 g/dL (ref 30.0–36.0)
MCV: 95.5 fL (ref 80.0–100.0)
Monocytes Absolute: 0.8 10*3/uL (ref 0.1–1.0)
Monocytes Relative: 5 %
Neutro Abs: 11.6 10*3/uL — ABNORMAL HIGH (ref 1.7–7.7)
Neutrophils Relative %: 77 %
Platelet Count: 298 10*3/uL (ref 150–400)
RBC: 3.98 MIL/uL (ref 3.87–5.11)
RDW: 12.9 % (ref 11.5–15.5)
WBC Count: 15.1 10*3/uL — ABNORMAL HIGH (ref 4.0–10.5)
nRBC: 0 % (ref 0.0–0.2)

## 2023-07-16 LAB — CMP (CANCER CENTER ONLY)
ALT: 9 U/L (ref 0–44)
AST: 17 U/L (ref 15–41)
Albumin: 4.1 g/dL (ref 3.5–5.0)
Alkaline Phosphatase: 80 U/L (ref 38–126)
Anion gap: 4 — ABNORMAL LOW (ref 5–15)
BUN: 29 mg/dL — ABNORMAL HIGH (ref 6–20)
CO2: 25 mmol/L (ref 22–32)
Calcium: 9.6 mg/dL (ref 8.9–10.3)
Chloride: 108 mmol/L (ref 98–111)
Creatinine: 1.05 mg/dL — ABNORMAL HIGH (ref 0.44–1.00)
GFR, Estimated: >60 mL/min (ref >60)
Glucose, Bld: 85 mg/dL (ref 70–99)
Potassium: 4.7 mmol/L (ref 3.5–5.1)
Sodium: 137 mmol/L (ref 135–145)
Total Bilirubin: 0.7 mg/dL (ref 0.3–1.2)
Total Protein: 7.1 g/dL (ref 6.5–8.1)

## 2023-07-16 LAB — D-DIMER, QUANTITATIVE: D-Dimer, Quant: 0.31 ug{FEU}/mL (ref 0.00–0.50)

## 2023-08-25 ENCOUNTER — Inpatient Hospital Stay: Payer: BC Managed Care – PPO | Attending: Hematology

## 2023-08-25 DIAGNOSIS — I2699 Other pulmonary embolism without acute cor pulmonale: Secondary | ICD-10-CM | POA: Insufficient documentation

## 2023-08-25 DIAGNOSIS — Z7901 Long term (current) use of anticoagulants: Secondary | ICD-10-CM | POA: Diagnosis not present

## 2023-08-25 DIAGNOSIS — I82411 Acute embolism and thrombosis of right femoral vein: Secondary | ICD-10-CM | POA: Insufficient documentation

## 2023-08-25 DIAGNOSIS — I824Y1 Acute embolism and thrombosis of unspecified deep veins of right proximal lower extremity: Secondary | ICD-10-CM

## 2023-08-25 LAB — D-DIMER, QUANTITATIVE: D-Dimer, Quant: <0.27 ug{FEU}/mL (ref 0.00–0.50)

## 2023-08-27 ENCOUNTER — Inpatient Hospital Stay (HOSPITAL_BASED_OUTPATIENT_CLINIC_OR_DEPARTMENT_OTHER): Payer: BC Managed Care – PPO | Admitting: Hematology

## 2023-08-27 DIAGNOSIS — I824Y1 Acute embolism and thrombosis of unspecified deep veins of right proximal lower extremity: Secondary | ICD-10-CM | POA: Diagnosis not present

## 2023-08-27 DIAGNOSIS — Z86711 Personal history of pulmonary embolism: Secondary | ICD-10-CM

## 2023-08-27 NOTE — Progress Notes (Signed)
HEMATOLOGY/ONCOLOGY TELE-MED VISIT NOTE  Date of Service: 08/27/2023  Patient Care Team: Everlean Cherry, MD as PCP - General (Family Medicine)  CHIEF COMPLAINTS/PURPOSE OF CONSULTATION:  Evaluation and management of acute DVT of proximal vein of RLE  HISTORY OF PRESENTING ILLNESS:  Dawn Arnold is a wonderful 52 y.o. female who is here for evaluation and management of acute DVT of proximal vein of RLE.   CTA chest showed Pulmonary emboli identified scattered in the lung bases. In particular significant clot burden in the left lower lobe. Peripheral wedge-shaped area of opacity and ground-glass dependently along the left lower lobe, and tiny left pleural effusion. Doppler US showed occlusive DVT in the RIGHT lower extremity at the saphenofemoral junction, in the femoral vein, and in the popliteal vein. There is nonocclusive thrombus in the right common femoral vein.  She was seen by Mariane Baumgarten, PA on 01/21/2023 for RLE DVT and pulmonary emboli. Patient complained of some tightness with deep inspirations, occasional pain in right thigh and behind knee.  Patient was seen by Dr. 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 Full hypercoagulable workup was reportedly negative. She was scheduled to have 6 months of anticoagulation and to take 81 MG aspirin once she stopped anticoagulation.   Today, she is accompanied by her husband. Patient does have obvious risk factors for her blood clot including Nuvaring and estrogen-based birth control. Her Nuvaring was removed in May and she denies any recent estrogen-based exposures. Patient has been on blood thinners since being diagnosed with PE.   She reports that she previously endorsed a sense of tightness in her legs and limited ability to bend/straighten at the knee when squatting or using the restroom. The symptoms are not present at rest. Her knee issues and leg swelling has improved over the last month. She does notice more LE swelling  when she is not exercising. Patient sometimes wears compression socks when exercising.   Patient denies any new SOB, chest pain, leg swelling, back pain, new lumps/bumps, or other new symptoms over the last 4 months. She denies being on any new medications.  Her breathing has returned to baseline and she denies any SOB or limitations with activity. Patient is not a smoker. She denies any upcoming planned travels.  She reports that vascular surgeon did not feel that there was a role for Prophylactic stenting.   Patient reports having an abnormal fecal occult testing result in June with PCP. She is planning to have a colonoscopy with her PCP once she completes her course of blood thinners.   She was previously on progesterone-based IUD and notes trying Mirena IUD previously. Patient was seen by OB/GYN to discuss other contraceptive options. There were considerations of a copper IUD.   Patient does experience heavy menstrual cycles at this time. She notes that she was not having any periods during her fecal occult testing.   Mammogram in June showed findings of cyst, but there were no concerning findings. Patient is planning to have a colonoscopy soon. Patient has a scheduled dental cleaning at the end of October.  INTERVAL HISTORY: Dawn Arnold is a wonderful 52 y.o. female who is connected via phone continued evaluation and management of acute DVT of proximal vein of RLE. Patient was last connected with via telemedicine visit on 07/07/2023 and reported a sense of tightness in her legs with certain movements  I connected with Willer Auriemma on 08/27/23 at  8:40 AM EST by telephone visit and verified that I  am speaking with the correct person using two identifiers.   I discussed the limitations, risks, security and privacy concerns of performing an evaluation and management service by telemedicine and the availability of in-person appointments. I also discussed with the patient that there may be a  patient responsible charge related to this service. The patient expressed understanding and agreed to proceed.   Other persons participating in the visit and their role in the encounter: none   Patient's location: home  Provider's location: [facility]   Chief Complaint: acute DVT of proximal vein of RLE    Today, she reports that she has been feeling well overall over the last couple of months with no new concerns.   She reports that she has an upcoming Colonoscopy on Monday, 09/01/2023. Patient denies any changes in bowel habits, black stools, blood in stools, or mucus in stools. Patient did have a positive Hemoccult test in June 2024. She reports that she is UTD with her mammogram and pap smear.   She does endorse mild lower extremimty discomfort in the evenings. Patient does use compression socks regularly. Patient reports that her right leg is more swollen than the left. She reports that her lower extremity symptoms resolve in the mornings.   She reports that Aspirin has been mildly bothersome with regards to mild left sided abdominal pain to the left of her umbilicus, usually worse in mornings. Patient notes that her Aspirin is coated.   She reports some hip pain due to strain from exercise.   The results of her recent lab workup were discussed with her in detail.  MEDICAL HISTORY:  Past Medical History:  Diagnosis Date   Diverticulitis    Ectopic pregnancy    Pregnancy-induced hypertension    Pulmonary embolism (HCC)     SURGICAL HISTORY: Past Surgical History:  Procedure Laterality Date   IR RADIOLOGIST EVAL & MGMT  02/17/2023   MENISCUS REPAIR      SOCIAL HISTORY: Social History   Socioeconomic History   Marital status: Married    Spouse name: Not on file   Number of children: Not on file   Years of education: Not on file   Highest education level: Not on file  Occupational History   Not on file  Tobacco Use   Smoking status: Never   Smokeless tobacco: Not on file   Substance and Sexual Activity   Alcohol use: Never   Drug use: Never   Sexual activity: Not on file  Other Topics Concern   Not on file  Social History Narrative   Not on file   Social Determinants of Health   Financial Resource Strain: Not on file  Food Insecurity: Low Risk  (02/24/2023)   Received from [facility]   Hunger Vital Sign    Worried About Running Out of Food in the Last Year: Never true    Ran Out of Food in the Last Year: Never true  Transportation Needs: Not on file (02/24/2023)  Physical Activity: Not on file  Stress: Not on file  Social Connections: Not on file  Intimate Partner Violence: Not At Risk (01/21/2023)   Humiliation, Afraid, Rape, and Kick questionnaire    Fear of Current or Ex-Partner: No    Emotionally Abused: No    Physically Abused: No    Sexually Abused: No    FAMILY HISTORY: No family history on file.  ALLERGIES:  has No Known Allergies.  MEDICATIONS:  Current Outpatient Medications  Medication Sig Dispense Refill  acetaminophen (TYLENOL) 325 MG tablet Take 3 tablets (975 mg total) by mouth every 8 (eight) hours as needed for up to 30 doses. 30 tablet 0   apixaban (ELIQUIS) 5 MG TABS tablet Take 1 tablet (5 mg total) by mouth 2 (two) times daily. 60 tablet 6   ibuprofen (ADVIL) 200 MG tablet Take 200 mg by mouth every 6 (six) hours as needed. (Patient not taking: Reported on 01/21/2023)     polycarbophil (FIBERCON) 625 MG tablet Take 625 mg by mouth daily. gummie     No current facility-administered medications for this visit.    REVIEW OF SYSTEMS:    10 Point review of Systems was done is negative except as noted above.   PHYSICAL EXAMINATION: TELE-MED VISIT  LABORATORY DATA:  I have reviewed the data as listed  .    Latest Ref Rng & Units 07/16/2023    2:39 PM 06/24/2023    2:00 PM 02/02/2023   10:43 AM  CBC  WBC 4.0 - 10.5 K/uL 15.1  11.0  10.6   Hemoglobin 12.0 - 15.0 g/dL 59.5  63.8  75.6   Hematocrit 36.0 - 46.0 %  38.0  37.7  40.9   Platelets 150 - 400 K/uL 298  259  346     .    Latest Ref Rng & Units 07/16/2023    2:39 PM 06/24/2023    2:00 PM 02/02/2023   10:43 AM  CMP  Glucose 70 - 99 mg/dL 85  88  433   BUN 6 - 20 mg/dL 29  18  13    Creatinine 0.44 - 1.00 mg/dL 2.95  1.88  4.16   Sodium 135 - 145 mmol/L 137  138  136   Potassium 3.5 - 5.1 mmol/L 4.7  4.1  4.1   Chloride 98 - 111 mmol/L 108  105  101   CO2 22 - 32 mmol/L 25  29  23    Calcium 8.9 - 10.3 mg/dL 9.6  9.5  9.5   Total Protein 6.5 - 8.1 g/dL 7.1  6.6    Total Bilirubin 0.3 - 1.2 mg/dL 0.7  0.3    Alkaline Phos 38 - 126 U/L 80  83    AST 15 - 41 U/L 17  17    ALT 0 - 44 U/L 9  10       RADIOGRAPHIC STUDIES: I have personally reviewed the radiological images as listed and agreed with the findings in the report. No results found.  ASSESSMENT & PLAN:   52 y.o. female with  Acute DVT of proximal vein of RLE   PLAN: -Discussed lab results from 07/16/2023 in detail with patient. CBC showed WBC of 15.1K, hemoglobin of 12.9, and platelets of 298K. -D dimer remained negative on aspirin; no evidence of new clot formation at biochemical level while on aspirin -unsure if aspirin explains her left-sided abdominal symptoms -discussed option to continue baby aspirin if she is able to tolerate -discussed option of taking a preventative dose of Eliquis, 2.5 MG twice a day  -discussed that not taking anticoagulation would not be the primary recommendation due to increased risks. There would be a need to especially optimize hydration and wear compression socks regularly in this case.  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 Discussed that low-dose Aspirin would  reduce the risks from this standpoint as well.  -proceed with colonoscopy on Monday, 09/01/2023 -not unreasonable to hold aspirin for 5-7 days prior to her upcoming colonoscopy. Discussed that the timing to restart Aspirin would be based on her gastroenterologist.  -gastroenterologist shall evaluate whether her new abdominal symptoms might be related to aspirin or if there is another explanation. -discussed option of repeating D dimer testing 3 months after being on Aspirin to evaluate for any signs of fluctuating D dimer levels if it would have a bearing on her decision-making regarding anticoagulation use -recommend patient to stay UTD with her age appropriate cancer screenings  -continue to follow-up with PCP regularly -answered all of patient's questions in great detail  FOLLOW-UP: RTC with PCP  The total time spent in the appointment was 21 minutes* .  All of the patient's questions were answered with apparent satisfaction. The patient knows to call the clinic with any problems, questions or concerns.   Wyvonnia Lora MD MS AAHIVMS [facility] Hematology/Oncology Physician [facility]  .*Total Encounter Time as defined by the Centers for Medicare and Medicaid Services includes, in addition to the face-to-face time of a patient visit (documented in the note above) non-face-to-face time: obtaining and reviewing outside history, ordering and reviewing medications, tests or procedures, care coordination (communications with other health care professionals or caregivers) and documentation in the medical record.    I,Mitra Faeizi,acting as a Neurosurgeon for Wyvonnia Lora, MD.,have documented all relevant documentation on the behalf of Wyvonnia Lora, MD,as directed by  Wyvonnia Lora, MD while in the presence of Wyvonnia Lora, MD.  .I have reviewed the above documentation for accuracy and completeness, and I agree with the above. Johney Maine MD

## 2023-10-07 ENCOUNTER — Emergency Department (HOSPITAL_BASED_OUTPATIENT_CLINIC_OR_DEPARTMENT_OTHER)
Admission: EM | Admit: 2023-10-07 | Discharge: 2023-10-07 | Disposition: A | Discharge status: Home / Self Care | Payer: BC Managed Care – PPO | Attending: Emergency Medicine | Admitting: Emergency Medicine

## 2023-10-07 ENCOUNTER — Emergency Department (HOSPITAL_BASED_OUTPATIENT_CLINIC_OR_DEPARTMENT_OTHER): Payer: BC Managed Care – PPO

## 2023-10-07 ENCOUNTER — Other Ambulatory Visit: Payer: Self-pay

## 2023-10-07 ENCOUNTER — Encounter (HOSPITAL_BASED_OUTPATIENT_CLINIC_OR_DEPARTMENT_OTHER): Payer: Self-pay

## 2023-10-07 DIAGNOSIS — Z7901 Long term (current) use of anticoagulants: Secondary | ICD-10-CM | POA: Diagnosis not present

## 2023-10-07 DIAGNOSIS — M79604 Pain in right leg: Secondary | ICD-10-CM | POA: Diagnosis present

## 2023-10-07 LAB — D-DIMER, QUANTITATIVE: D-Dimer, Quant: <0.27 ug{FEU}/mL (ref 0.00–0.50)

## 2023-10-07 NOTE — ED Notes (Signed)
Labs sent

## 2023-10-07 NOTE — ED Provider Notes (Signed)
[place] [facility] Provider Note   CSN: [id number] Arrival date & time: 10/07/23  1541     History  Chief Complaint  Patient presents with   Leg Pain    Dawn Arnold is a 53 y.o. female with a history of pulmonary embolism and DVT who presents the ED today for leg pain.  Patient reports that she has pain in the right groin and behind the right knee that began today.  She states that had multiple blood clots in her right leg last April after being diagnosed with a pulmonary embolism.  She states that she exercises regularly and does not know if a muscle strain can be causing her symptoms versus a new blood clot.  Patient was on Eliquis until November and then was on baby aspirin until December.  She is currently not on any anticoagulants.  No recent travel, surgeries, or oral hormone use.  Denies chest pain or shortness of breath.    Home Medications Prior to Admission medications   Medication Sig Start Date End Date Taking? Authorizing Provider  acetaminophen (TYLENOL) 325 MG tablet Take 3 tablets (975 mg total) by mouth every 8 (eight) hours as needed for up to 30 doses. 01/10/23   Terald Sleeper, MD  apixaban (ELIQUIS) 5 MG TABS tablet Take 1 tablet (5 mg total) by mouth 2 (two) times daily. 01/23/23   Briant Cedar, PA-C  ibuprofen (ADVIL) 200 MG tablet Take 200 mg by mouth every 6 (six) hours as needed. Patient not taking: Reported on 01/21/2023    [provider]  polycarbophil (FIBERCON) 625 MG tablet Take 625 mg by mouth daily. gummie    [provider]      Allergies    Patient has no known allergies.    Review of Systems   Review of Systems  Musculoskeletal:        Right leg pain  All other systems reviewed and are negative.   Physical Exam Updated Vital Signs BP 137/75 (BP Location: Right Arm)   Pulse (!) 58   Temp 98 F (36.7 C)   Resp 18   Ht 5\' 7"  (1.702 m)   Wt 67.1 kg   LMP 10/07/2023   SpO2 100%    BMI 23.18 kg/m  Physical Exam Vitals and nursing note reviewed.  Constitutional:      Appearance: Normal appearance.  HENT:     Head: Normocephalic and atraumatic.     Mouth/Throat:     Mouth: Mucous membranes are moist.  Eyes:     Conjunctiva/sclera: Conjunctivae normal.     Pupils: Pupils are equal, round, and reactive to light.  Cardiovascular:     Rate and Rhythm: Normal rate and regular rhythm.     Pulses: Normal pulses.     Heart sounds: Normal heart sounds.  Pulmonary:     Effort: Pulmonary effort is normal.     Breath sounds: Normal breath sounds.  Abdominal:     Palpations: Abdomen is soft.     Tenderness: There is no abdominal tenderness.  Musculoskeletal:        General: Tenderness present. Normal range of motion.     Cervical back: Normal range of motion.     Right lower leg: No edema.     Left lower leg: No edema.     Comments: Tenderness to palpation of the posterior right knee and right groin.  No thigh tenderness or calf tenderness.  Positive Homans' sign.  No tenderness to palpation of the left lower extremity.  Skin:    General: Skin is warm and dry.     Findings: No rash.  Neurological:     General: No focal deficit present.     Mental Status: She is alert.  Psychiatric:        Mood and Affect: Mood normal.        Behavior: Behavior normal.     ED Results / Procedures / Treatments   Labs (all labs ordered are listed, but only abnormal results are displayed) Labs Reviewed  D-DIMER, QUANTITATIVE    EKG None  Radiology US Venous Img Lower Unilateral Right Result Date: 10/07/2023 CLINICAL DATA:  Right lower extremity pain.  History of DVT. EXAM: RIGHT LOWER EXTREMITY VENOUS DOPPLER ULTRASOUND TECHNIQUE: Gray-scale sonography with graded compression, as well as color Doppler and duplex ultrasound were performed to evaluate the lower extremity deep venous systems from the level of the common femoral vein and including the common femoral, femoral,  profunda femoral, popliteal and calf veins including the posterior tibial, peroneal and gastrocnemius veins when visible. The superficial great saphenous vein was also interrogated. Spectral Doppler was utilized to evaluate flow at rest and with distal augmentation maneuvers in the common femoral, femoral and popliteal veins. COMPARISON:  Bilateral lower extremity venous duplex study on 01/10/2023 FINDINGS: Contralateral Common Femoral Vein: Respiratory phasicity is normal and symmetric with the symptomatic side. No evidence of thrombus. Normal compressibility. Common Femoral Vein: No evidence of thrombus. Previous nonocclusive thrombus has resolved. Normal compressibility, respiratory phasicity and response to augmentation. Saphenofemoral Junction: No evidence of thrombus. Normal compressibility and flow on color Doppler imaging. Profunda Femoral Vein: No evidence of thrombus. Normal compressibility and flow on color Doppler imaging. Femoral Vein: The right femoral vein has recanalized with some probable component of mild chronic mural thrombus remaining. The recanalized venous lumen is small. There is some to and fro and partially reversed flow present consistent with some degree deep venous reflux. Popliteal Vein: The right popliteal vein has recanalized with no further thrombus identified. Calf Veins: No evidence of thrombus. Normal compressibility and flow on color Doppler imaging. Superficial Great Saphenous Vein: No evidence of thrombus. Normal compressibility. Venous Reflux:  None. Other Findings: No evidence of superficial thrombophlebitis or abnormal fluid collection. IMPRESSION: 1. 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 The recanalized venous lumen is small. There is some to and fro and partially reversed flow present consistent with some degree of deep venous reflux. 2. Previous nonocclusive  thrombus in the right common femoral vein has resolved. 3. Resolved right popliteal vein thrombus. Electronically Signed   By: Irish Lack M.D.   On: 10/07/2023 17:05    Procedures Procedures: not indicated.   Medications Ordered in ED Medications - No data to display  ED Course/ Medical Decision Making/ A&P                                 Medical Decision Making  This patient presents to the ED for concern of right leg pain, this involves an extensive number of treatment options, and is a complaint that carries with it a high risk of complications and morbidity.   Differential diagnosis includes: muscle strain/spasm, DVT, SVT, phlebitis, cellulitis, ligamentous injury, etc.    Comorbidities  See HPI above   Additional History  Additional history obtained from prior hematology records.   Lab Tests  I ordered and personally interpreted labs.  The pertinent results include:   Negative d-dimer   Imaging Studies  I ordered imaging studies including right lower extremity venous ultrasound  I independently visualized and interpreted imaging which showed:  Recanalization of the right femoral and popliteal veins with some probable component of mild chronic mural thrombus remaining in right femoral vein. The recanalized lumen is small. Previous nonocclusive thrombus in right common femoral vein has resolved. Resolved right popliteal vein thrombus. I agree with the radiologist interpretation   Problem List / ED Course / Critical Interventions / Medication Management  Right leg pain since this morning Patient has a history of pulmonary embolism and DVTs in the right leg that was diagnosed last April.  She was on Eliquis until November and then taking baby aspirin until December.  She states that she has been having dull, achy pain to the right groin and behind the right knee today.  She states that she works out so she is now if the pain can be associated with that or with  a DVT.  No leg swelling, shortness of breath, or chest pain.  No oral hormone use, recent travel, or recent surgeries. Discussed findings with my attending, Dr. Troponin, who evaluated patient as well. Labs and imaging were reassuring.  Close primary care follow-up recommended.   Social Determinants of Health  Physical activity   Test / Admission - Considered  Patient is stable and safe for discharge home. Return precautions provided.       Final Clinical Impression(s) / ED Diagnoses Final diagnoses:  Right leg pain    Rx / DC Orders ED Discharge Orders     None         Maxwell Marion, PA-C 10/07/23 1851    Terald Sleeper, MD 10/07/23 2000

## 2023-10-07 NOTE — Discharge Instructions (Signed)
Please return to the ER if you have significant worsening of your pain, change of color in your leg or foot, inability to walk, or sudden chest pain, difficulty breathing, loss of consciousness.

## 2023-10-07 NOTE — ED Triage Notes (Signed)
Pt reports pain in right leg and groin area. Wants to be cleared for DVT.  Was on Eliquis until Nov 2 due to PE.  Denies Shortness of Breath or chest pain.

## 2023-10-07 NOTE — ED Notes (Signed)
Lab notified of D-Dimer

## 2023-10-08 ENCOUNTER — Encounter: Payer: Self-pay | Admitting: Hematology

## 2023-11-10 ENCOUNTER — Inpatient Hospital Stay: Payer: BC Managed Care – PPO | Attending: Hematology | Admitting: Hematology

## 2023-11-10 DIAGNOSIS — I82511 Chronic embolism and thrombosis of right femoral vein: Secondary | ICD-10-CM | POA: Insufficient documentation

## 2023-11-10 DIAGNOSIS — I824Y1 Acute embolism and thrombosis of unspecified deep veins of right proximal lower extremity: Secondary | ICD-10-CM

## 2023-11-10 DIAGNOSIS — I2699 Other pulmonary embolism without acute cor pulmonale: Secondary | ICD-10-CM | POA: Insufficient documentation

## 2023-11-10 MED ORDER — APIXABAN 2.5 MG PO TABS
2.5000 mg | ORAL_TABLET | Freq: Two times a day (BID) | ORAL | 4 refills | Status: AC
Start: 2023-11-10 — End: ?

## 2023-11-10 NOTE — Progress Notes (Signed)
 HEMATOLOGY/ONCOLOGY TELE-MED VISIT NOTE  Date of Service: 11/10/2023  Patient Care Team: Dawn Cherry, MD as PCP - General (Family Medicine)  CHIEF COMPLAINTS/PURPOSE OF CONSULTATION:  Evaluation and management of acute DVT of proximal vein of RLE  HISTORY OF PRESENTING ILLNESS:  Dawn Arnold is a wonderful 53 y.o. female who is here for evaluation and management of acute DVT of proximal vein of RLE.   CTA chest showed Pulmonary emboli identified scattered in the lung bases. In particular significant clot burden in the left lower lobe. Peripheral wedge-shaped area of opacity and ground-glass dependently along the left lower lobe, and tiny left pleural effusion. Doppler US showed occlusive DVT in the RIGHT lower extremity at the saphenofemoral junction, in the femoral vein, and in the popliteal vein. There is nonocclusive thrombus in the right common femoral vein.  She was seen by Dawn Baumgarten, PA on 01/21/2023 for RLE DVT and pulmonary emboli. Patient complained of some tightness with deep inspirations, occasional pain in right thigh and behind knee.  Patient was seen by Dr. Myra Arnold on 02/20/2023 for May-thurner findings on CT scan. Full hypercoagulable workup was reportedly negative. She was scheduled to have 6 months of anticoagulation and to take 81 MG aspirin once she stopped anticoagulation.   Today, she is accompanied by her husband. Patient does have obvious risk factors for her blood clot including Nuvaring and estrogen-based birth control. Her Nuvaring was removed in May and she denies any recent estrogen-based exposures. Patient has been on blood thinners since being diagnosed with PE.   She reports that she previously endorsed a sense of tightness in her legs and limited ability to bend/straighten at the knee when squatting or using the restroom. The symptoms are not present at rest. Her knee issues and leg swelling has improved over the last month. She does notice more LE swelling  when she is not exercising. Patient sometimes wears compression socks when exercising.   Patient denies any new SOB, chest pain, leg swelling, back pain, new lumps/bumps, or other new symptoms over the last 4 months. She denies being on any new medications.  Her breathing has returned to baseline and she denies any SOB or limitations with activity. Patient is not a smoker. She denies any upcoming planned travels.  She reports that vascular surgeon did not feel that there was a role for Prophylactic stenting.   Patient reports having an abnormal fecal occult testing result in June with PCP. She is planning to have a colonoscopy with her PCP once she completes her course of blood thinners.   She was previously on progesterone-based IUD and notes trying Mirena IUD previously. Patient was seen by OB/GYN to discuss other contraceptive options. There were considerations of a copper IUD.   Patient does experience heavy menstrual cycles at this time. She notes that she was not having any periods during her fecal occult testing.   Mammogram in June showed findings of cyst, but there were no concerning findings. Patient is planning to have a colonoscopy soon. Patient has a scheduled dental cleaning at the end of October.  INTERVAL HISTORY: Dawn Arnold is a wonderful 53 y.o. female who is connected via phone continued evaluation and management of acute DVT of proximal vein of RLE.   I connected with Dawn Arnold on 11/10/23 at  3:30 PM EST by telephone visit and verified that I am speaking with the correct person using two identifiers.   I last connected with the patient on 08/27/2023 and she  complained of mild lower extremity discomfort, right leg swelling, and mild LLQ Abdominal pain.  Patient recently had colonoscopy which showed small benign polyp and mild diverticulosis. She notes that the small benign polyps were removed.   Patient was recently admitted to the ED on 10/07/2023 due to severe  right inguinal and right leg pain. She had an ultrasound, which did not show any acute clotting but did show chronic mural thrombus in the proximal femoral vein. D-dimer was undetectable. She was discharged from the hospital on the same day.   Patient notes she has been doing well since getting discharged from the hospital. She denies any new infection issues, fever, chills, night sweats, unexpected weight loss, back pain, chest pain, abdominal pain, or leg swelling.   She notes that she does have occasional right leg and inguinal pain.   She notes that baby aspirin was causing stomach issues and abdominal discomfort. She discontinued aspirin or any other blood thinners since December 9th.  I discussed the limitations, risks, security and privacy concerns of performing an evaluation and management service by telemedicine and the availability of in-person appointments. I also discussed with the patient that there may be a patient responsible charge related to this service. The patient expressed understanding and agreed to proceed.   Other persons participating in the visit and their role in the encounter: none   Patient's location: home  Provider's location: [facility]   Chief Complaint: acute DVT of proximal vein of RLE    -Discussed lab results from    MEDICAL HISTORY:  Past Medical History:  Diagnosis Date   Diverticulitis    Ectopic pregnancy    Pregnancy-induced hypertension    Pulmonary embolism (HCC)     SURGICAL HISTORY: Past Surgical History:  Procedure Laterality Date   IR RADIOLOGIST EVAL & MGMT  02/17/2023   MENISCUS REPAIR      SOCIAL HISTORY: Social History   Socioeconomic History   Marital status: Married    Spouse name: Not on file   Number of children: Not on file   Years of education: Not on file   Highest education level: Not on file  Occupational History   Not on file  Tobacco Use   Smoking status: Never   Smokeless tobacco: Not on file  Substance and  Sexual Activity   Alcohol use: Never   Drug use: Never   Sexual activity: Not on file  Other Topics Concern   Not on file  Social History Narrative   Not on file   Social Drivers of Health   Financial Resource Strain: Not on file  Food Insecurity: Low Risk  (02/24/2023)   Received from [facility]   Hunger Vital Sign    Worried About Running Out of Food in the Last Year: Never true    Ran Out of Food in the Last Year: Never true  Transportation Needs: Not on file (02/24/2023)  Physical Activity: Not on file  Stress: Not on file  Social Connections: Not on file  Intimate Partner Violence: Not At Risk (01/21/2023)   Humiliation, Afraid, Rape, and Kick questionnaire    Fear of Current or Ex-Partner: No    Emotionally Abused: No    Physically Abused: No    Sexually Abused: No    FAMILY HISTORY: No family history on file.  ALLERGIES:  has no known allergies.  MEDICATIONS:  Current Outpatient Medications  Medication Sig Dispense Refill   acetaminophen (TYLENOL) 325 MG tablet Take 3 tablets (975 mg total)  by mouth every 8 (eight) hours as needed for up to 30 doses. 30 tablet 0   apixaban (ELIQUIS) 5 MG TABS tablet Take 1 tablet (5 mg total) by mouth 2 (two) times daily. 60 tablet 6   ibuprofen (ADVIL) 200 MG tablet Take 200 mg by mouth every 6 (six) hours as needed. (Patient not taking: Reported on 01/21/2023)     polycarbophil (FIBERCON) 625 MG tablet Take 625 mg by mouth daily. gummie     No current facility-administered medications for this visit.    REVIEW OF SYSTEMS:    10 Point review of Systems was done is negative except as noted above.   PHYSICAL EXAMINATION: TELE-MED VISIT  LABORATORY DATA:  I have reviewed the data as listed  .    Latest Ref Rng & Units 07/16/2023    2:39 PM 06/24/2023    2:00 PM 02/02/2023   10:43 AM  CBC  WBC 4.0 - 10.5 K/uL 15.1  11.0  10.6   Hemoglobin 12.0 - 15.0 g/dL 16.1  09.6  04.5   Hematocrit 36.0 - 46.0 % 38.0  37.7  40.9    Platelets 150 - 400 K/uL 298  259  346     .    Latest Ref Rng & Units 07/16/2023    2:39 PM 06/24/2023    2:00 PM 02/02/2023   10:43 AM  CMP  Glucose 70 - 99 mg/dL 85  88  409   BUN 6 - 20 mg/dL 29  18  13    Creatinine 0.44 - 1.00 mg/dL 8.11  9.14  7.82   Sodium 135 - 145 mmol/L 137  138  136   Potassium 3.5 - 5.1 mmol/L 4.7  4.1  4.1   Chloride 98 - 111 mmol/L 108  105  101   CO2 22 - 32 mmol/L 25  29  23    Calcium 8.9 - 10.3 mg/dL 9.6  9.5  9.5   Total Protein 6.5 - 8.1 g/dL 7.1  6.6    Total Bilirubin 0.3 - 1.2 mg/dL 0.7  0.3    Alkaline Phos 38 - 126 U/L 80  83    AST 15 - 41 U/L 17  17    ALT 0 - 44 U/L 9  10       RADIOGRAPHIC STUDIES: I have personally reviewed the radiological images as listed and agreed with the findings in the report. No results found.  ASSESSMENT & PLAN:   53 y.o. female with  Acute DVT of proximal vein of RLE  2. 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 -patient notes she cannot tolerate ASA since it is causing abdominal discomfort -Discussed with the patient the option of preventative dose of blood thinner or not taking blood thinner at all. -Educated the patient regarding pros and cons of taking and not taking blood thinner.  -Patient wants to proceed with preventative dose of Eliquis given recurrent rt lower extremity symptoms and risk of VTE from left sided May thurner physiology -Will prescribe preventative dose of Eliquis. Will given refill for a few months till she can see her PCP -recommend to wear compression socks.  -Answered all of patient's questions.   FOLLOW-UP: RTC with PCP  The total time spent in the appointment was 23 minutes* .  All of the patient's  questions were answered with apparent satisfaction. The patient knows to call the clinic with any  problems, questions or concerns.   Wyvonnia Lora MD MS AAHIVMS [facility] Hematology/Oncology Physician [facility]  .*Total Encounter Time as defined by the Centers for Medicare and Medicaid Services includes, in addition to the face-to-face time of a patient visit (documented in the note above) non-face-to-face time: obtaining and reviewing outside history, ordering and reviewing medications, tests or procedures, care coordination (communications with other health care professionals or caregivers) and documentation in the medical record.   I,Param Shah,acting as a Neurosurgeon for Wyvonnia Lora, MD.,have documented all relevant documentation on the behalf of Wyvonnia Lora, MD,as directed by  Wyvonnia Lora, MD while in the presence of Wyvonnia Lora, MD.  .I have reviewed the above documentation for accuracy and completeness, and I agree with the above. Johney Maine MD

## 2023-11-26 IMAGING — CR DG SACRUM/COCCYX 2+V
3 series · 3 of 3 positions shown · non-contrast
Comparison: None.

CLINICAL DATA: Fell with pain in the sacrum and coccyx.

EXAM:
SACRUM AND COCCYX - 2+ VIEW

[coccyx ap]
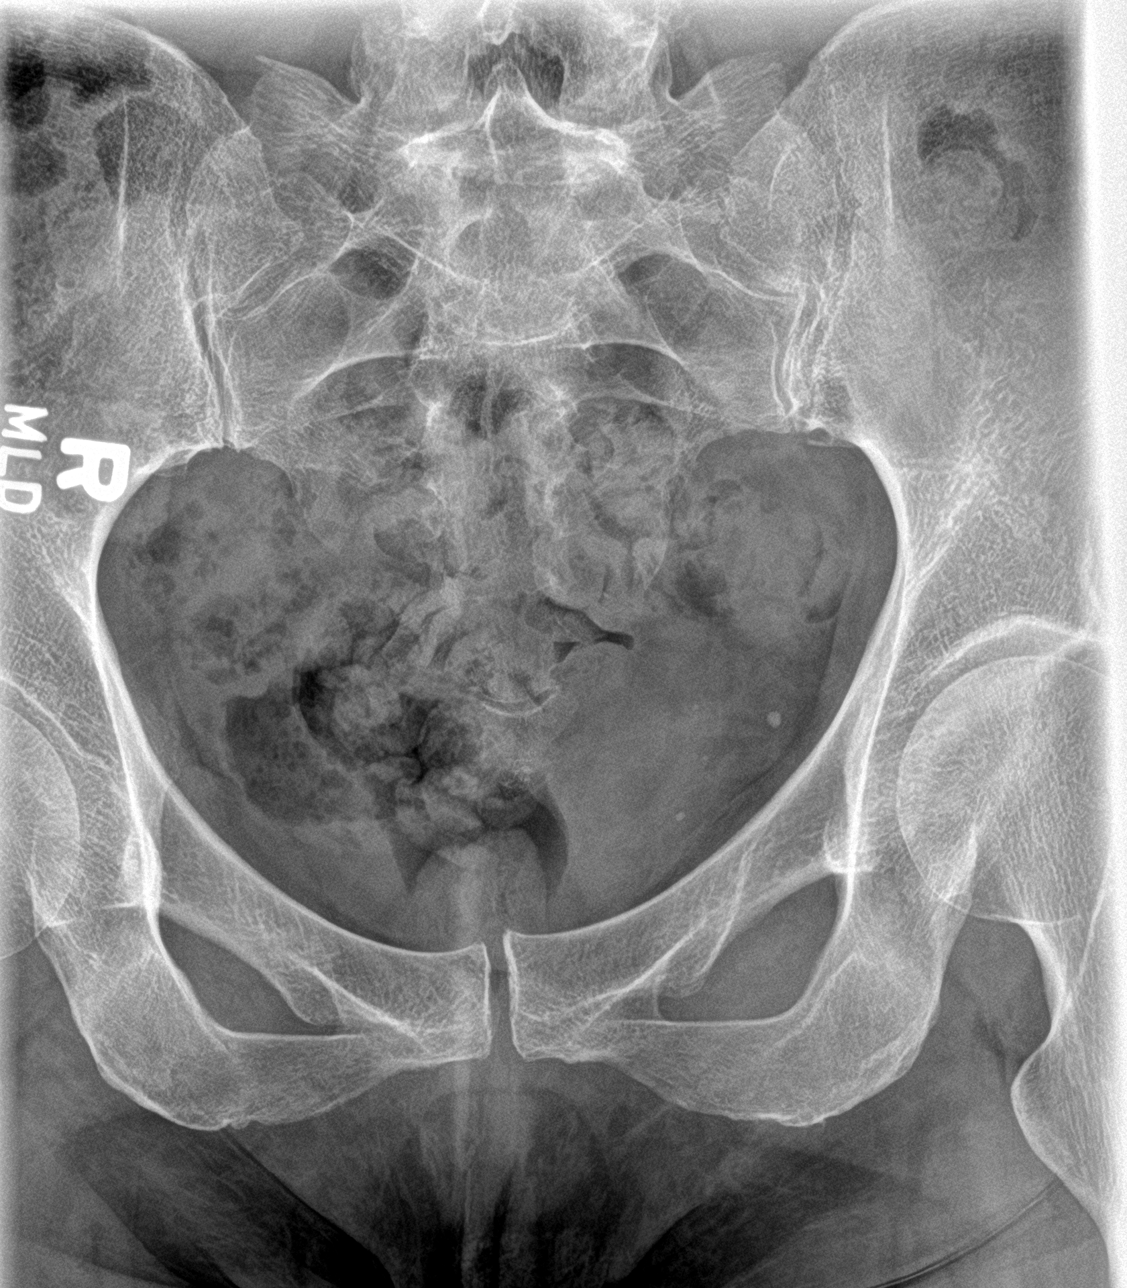

[sacrum ap]
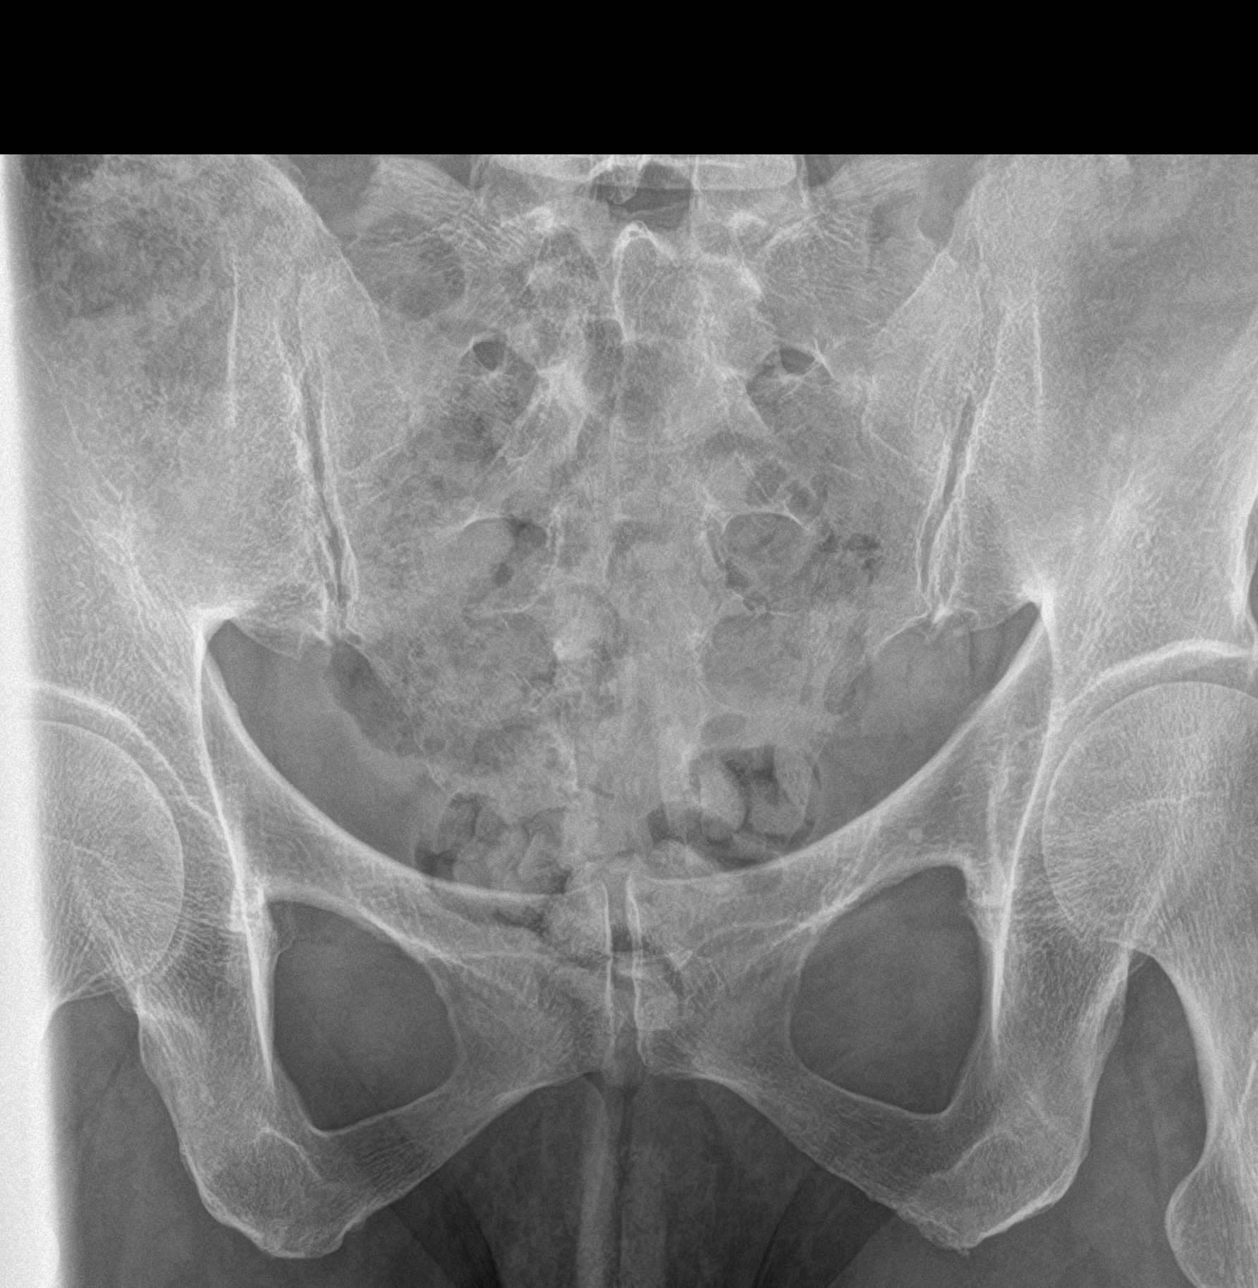

[sacrum lat]
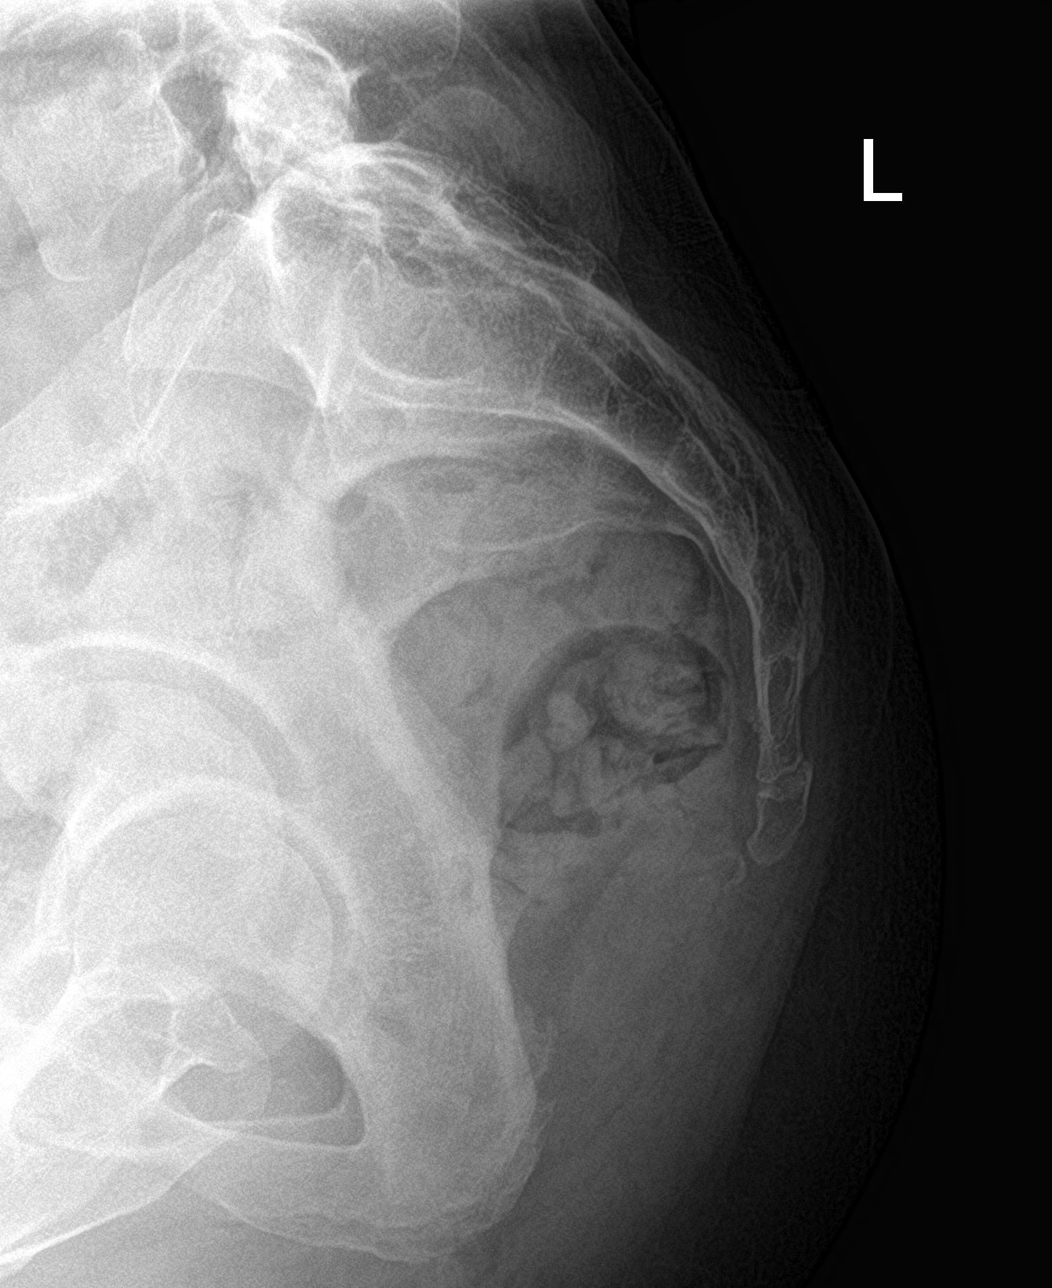

[3 of 3 positions shown; findings below may reference images not displayed]

FINDINGS: There is no evidence of fracture or other focal bone lesions.
IMPRESSION: Normal radiographs.

## 2024-08-18 ENCOUNTER — Other Ambulatory Visit: Payer: Self-pay

## 2024-08-18 ENCOUNTER — Emergency Department (HOSPITAL_BASED_OUTPATIENT_CLINIC_OR_DEPARTMENT_OTHER)

## 2024-08-18 ENCOUNTER — Emergency Department (HOSPITAL_BASED_OUTPATIENT_CLINIC_OR_DEPARTMENT_OTHER)
Admission: EM | Admit: 2024-08-18 | Discharge: 2024-08-18 | Disposition: A | Discharge status: Home / Self Care | Attending: Emergency Medicine | Admitting: Emergency Medicine

## 2024-08-18 ENCOUNTER — Encounter (HOSPITAL_BASED_OUTPATIENT_CLINIC_OR_DEPARTMENT_OTHER): Payer: Self-pay

## 2024-08-18 DIAGNOSIS — M25461 Effusion, right knee: Secondary | ICD-10-CM | POA: Insufficient documentation

## 2024-08-18 DIAGNOSIS — R600 Localized edema: Secondary | ICD-10-CM | POA: Insufficient documentation

## 2024-08-18 DIAGNOSIS — M25561 Pain in right knee: Secondary | ICD-10-CM | POA: Insufficient documentation

## 2024-08-18 NOTE — Discharge Instructions (Signed)
 You were seen in the department for swelling in the right knee There is some evidence on the ultrasound of stable old blood clots but no new blood clots in your right lower extremity that would require you to get back on Eliquis  The swelling in your knee may be from overexertion but it is important that you keep exercising You can try compressive wrap or sleeve to help with the swelling Apply ice or heat Return to the Emergency Department for severe pain shortness of breath or any other concerns

## 2024-08-18 NOTE — ED Provider Notes (Signed)
 [place] [facility] Provider Note   CSN: [id number] Arrival date & time: 08/18/24  1134     Patient presents with: Knee Pain   Dawn Arnold is a 53 y.o. female.  With a history of VTE who presents to the ED for right lower extremity pain.  Right knee pain and swelling for the last week and a half.  Patient does exercise vigorously has had swelling over the medial superior aspect of right knee.  No specific trauma.  She is no longer on Eliquis .  No shortness of breath or other complaints at this time.    Knee Pain      Prior to Admission medications   Medication Sig Start Date End Date Taking? Authorizing Provider  acetaminophen  (TYLENOL ) 325 MG tablet Take 3 tablets (975 mg total) by mouth every 8 (eight) hours as needed for up to 30 doses. 01/10/23   Cottie Donnice PARAS, MD  apixaban  (ELIQUIS ) 2.5 MG TABS tablet Take 1 tablet (2.5 mg total) by mouth 2 (two) times daily. 11/10/23   Kale, Gautam Kishore, MD  clindamycin (CLEOCIN T) 1 % external solution Apply 1 Application topically 2 (two) times daily as needed.    [provider]  ibuprofen (ADVIL) 200 MG tablet Take 200 mg by mouth every 6 (six) hours as needed. Patient not taking: Reported on 01/21/2023    [provider]  polycarbophil (FIBERCON) 625 MG tablet Take 625 mg by mouth daily. gummie    [provider]    Allergies: Patient has no known allergies.    Review of Systems  Updated Vital Signs BP (!) 145/82 (BP Location: Right Arm)   Pulse 85   Temp 98.9 F (37.2 C)   Resp 18   LMP 08/10/2024 (Approximate)   SpO2 100%   Physical Exam Vitals and nursing note reviewed.  HENT:     Head: Normocephalic and atraumatic.  Eyes:     Pupils: Pupils are equal, round, and reactive to light.  Cardiovascular:     Rate and Rhythm: Normal rate and regular rhythm.  Pulmonary:     Effort: Pulmonary effort is normal.     Breath sounds: Normal breath sounds.   Abdominal:     Palpations: Abdomen is soft.     Tenderness: There is no abdominal tenderness.  Musculoskeletal:     Comments: No calf tenderness palpable cords 2+ DP pulse No ecchymosis In right foot trace edema over medial superior aspect of right knee without tenderness  Skin:    General: Skin is warm and dry.  Neurological:     Mental Status: She is alert.  Psychiatric:        Mood and Affect: Mood normal.     (all labs ordered are listed, but only abnormal results are displayed) Labs Reviewed - No data to display  EKG: None  Radiology: US  Venous Img Lower Unilateral Right Result Date: 08/18/2024 CLINICAL DATA:  Right knee pain and history of right femoropopliteal vein DVT. EXAM: RIGHT LOWER EXTREMITY VENOUS DOPPLER ULTRASOUND TECHNIQUE: Gray-scale sonography with graded compression, as well as color Doppler and duplex ultrasound were performed to evaluate the lower extremity deep venous systems from the level of the common femoral vein and including the common femoral, femoral, profunda femoral, popliteal and calf veins including the posterior tibial, peroneal and gastrocnemius veins when visible. The superficial great saphenous vein was also interrogated. Spectral Doppler was utilized to evaluate flow at rest and with distal augmentation maneuvers in  the common femoral, femoral and popliteal veins. COMPARISON:  10/07/2023 FINDINGS: Contralateral Common Femoral Vein: Respiratory phasicity is normal and symmetric with the symptomatic side. No evidence of thrombus. Normal compressibility. Common Femoral Vein: No evidence of thrombus. Normal compressibility, respiratory phasicity and response to augmentation. Saphenofemoral Junction: No evidence of thrombus. Normal compressibility and flow on color Doppler imaging. Profunda Femoral Vein: No evidence of thrombus. Normal compressibility and flow on color Doppler imaging. Femoral Vein: Stable appearance narrowed right femoral vein lumen with  chronic mural thrombus present. Popliteal Vein: Flow is present in the right popliteal vein with potentially a trace amount of chronic mural thrombus present. Calf Veins: No evidence of thrombus. Normal compressibility and flow on color Doppler imaging. Superficial Great Saphenous Vein: No evidence of thrombus. Normal compressibility. Venous Reflux:  None. Other Findings: No evidence of superficial thrombophlebitis. There is some fluid adjacent to the knee joint medially and proximal tibia measuring up to 2.5 cm and likely representing extension of some joint fluid rather than a Baker's cyst. IMPRESSION: 1. Stable appearance of the right femoral vein with chronic mural thrombus present. 2. Potential trace amount of chronic mural thrombus in the right popliteal vein. 3. Fluid adjacent to the knee joint medially and proximal tibia likely representing extension of some joint fluid rather than a Baker's cyst. Electronically Signed   By: Marcey Moan M.D.   On: 08/18/2024 13:06   DG Knee Complete 4 Views Right Result Date: 08/18/2024 EXAM: 4 OR MORE VIEW(S) XRAY OF THE RIGHT KNEE 08/18/2024 12:10:00 PM COMPARISON: None available. CLINICAL HISTORY: knee pain FINDINGS: BONES AND JOINTS: No acute fracture. No focal osseous lesion. No joint dislocation. Minimal suprapatellar joint effusion is noted. Minimal narrowing of medial joint space is noted. SOFT TISSUES: The soft tissues are unremarkable. IMPRESSION: 1. Minimal suprapatellar joint effusion. 2. Minimal medial compartment joint space narrowing. Electronically signed by: Lynwood Seip MD 08/18/2024 12:51 PM EST RP Workstation: [id number]     Procedures   Medications Ordered in the ED - No data to display                                  Medical Decision Making 53 year old female with history as above presented to the ED given concern for potential recurrent right lower extremity DVT.  Ultrasound does show old stable right lower extremity thrombus with  some increased joint effusion in the right knee where patient has not experienced swelling.  No new clot burden or occlusive signs.  Benign exam.  Suspect right knee pain swelling may be related to exertion.  Counseled her on symptomatic management and instructed for PCP follow-up.  Return precautions were discussed in detail.  Amount and/or Complexity of Data Reviewed Radiology: ordered.        Final diagnoses:  Acute pain of right knee    ED Discharge Orders     None          Pamella Ozell LABOR, DO 08/18/24 1454

## 2024-08-18 NOTE — ED Triage Notes (Signed)
 R knee pain and swelling after exercise for 1.5 weeks  Hx of DVT. No obvious discoloration noted

## 2024-09-08 ENCOUNTER — Other Ambulatory Visit (HOSPITAL_BASED_OUTPATIENT_CLINIC_OR_DEPARTMENT_OTHER): Payer: Self-pay

## 2024-09-08 ENCOUNTER — Encounter (HOSPITAL_BASED_OUTPATIENT_CLINIC_OR_DEPARTMENT_OTHER): Payer: Self-pay

## 2024-09-08 ENCOUNTER — Ambulatory Visit (HOSPITAL_BASED_OUTPATIENT_CLINIC_OR_DEPARTMENT_OTHER)
Admission: EM | Admit: 2024-09-08 | Discharge: 2024-09-08 | Disposition: A | Discharge status: Home / Self Care | Attending: Family Medicine | Admitting: Family Medicine

## 2024-09-08 DIAGNOSIS — R300 Dysuria: Secondary | ICD-10-CM | POA: Insufficient documentation

## 2024-09-08 LAB — POCT URINE DIPSTICK
Bilirubin, UA: NEGATIVE
Glucose, UA: NEGATIVE mg/dL
Ketones, POC UA: NEGATIVE mg/dL
Nitrite, UA: POSITIVE — AB
POC PROTEIN,UA: NEGATIVE
Spec Grav, UA: 1.01
Urobilinogen, UA: 0.2 U/dL
pH, UA: 5.5

## 2024-09-08 MED ORDER — NITROFURANTOIN MONOHYD MACRO 100 MG PO CAPS
100.0000 mg | ORAL_CAPSULE | Freq: Two times a day (BID) | ORAL | 0 refills | Status: AC
  Filled 2024-09-08: qty 10, 5d supply, fill #0

## 2024-09-08 NOTE — Discharge Instructions (Addendum)
 Treating you for urinary tract infection.  Take the antibiotics as prescribed.  Will send the urine for culture and call with any changes if needed.  Make sure you are drinking plenty of fluids.  You can take Azo over-the-counter for your symptoms.  Follow-up as needed

## 2024-09-08 NOTE — ED Triage Notes (Signed)
 Onset last night of dysuria. Burning and urgency.

## 2024-09-08 NOTE — ED Provider Notes (Signed)
 " [facility]    CSN: [id number] Arrival date & time: 09/08/24  9061      History   Chief Complaint Chief Complaint  Patient presents with   Dysuria    HPI Dawn Arnold is a 53 y.o. female.   Patient is a 53 year old female that presents today with dysuria, urinary frequency onset yesterday.  Symptoms have been constant.  She took Azo this morning with some relief.  No fevers, chills, body aches or flank pain.   Dysuria   Past Medical History:  Diagnosis Date   Diverticulitis    Ectopic pregnancy    Pregnancy-induced hypertension    Pulmonary embolism (HCC)     There are no active problems to display for this patient.   Past Surgical History:  Procedure Laterality Date   IR RADIOLOGIST EVAL & MGMT  02/17/2023   MENISCUS REPAIR      OB History   No obstetric history on file.      Home Medications    Prior to Admission medications  Medication Sig Start Date End Date Taking? Authorizing Provider  nitrofurantoin , macrocrystal-monohydrate, (MACROBID ) 100 MG capsule Take 1 capsule (100 mg total) by mouth 2 (two) times daily. 09/08/24  Yes Demesha Boorman A, FNP  acetaminophen  (TYLENOL ) 325 MG tablet Take 3 tablets (975 mg total) by mouth every 8 (eight) hours as needed for up to 30 doses. 01/10/23   Cottie Donnice PARAS, MD  apixaban  (ELIQUIS ) 2.5 MG TABS tablet Take 1 tablet (2.5 mg total) by mouth 2 (two) times daily. 11/10/23   Kale, Gautam Kishore, MD  clindamycin (CLEOCIN T) 1 % external solution Apply 1 Application topically 2 (two) times daily as needed.    [provider]  ibuprofen (ADVIL) 200 MG tablet Take 200 mg by mouth every 6 (six) hours as needed. Patient not taking: Reported on 01/21/2023    [provider]  polycarbophil (FIBERCON) 625 MG tablet Take 625 mg by mouth daily. gummie    [provider]    Family History History reviewed. No pertinent family history.  Social History Social History[1]   Allergies    Patient has no known allergies.   Review of Systems Review of Systems  Genitourinary:  Positive for dysuria.     Physical Exam Triage Vital Signs ED Triage Vitals  Encounter Vitals Group     BP 09/08/24 1015 (!) 144/90     Girls Systolic BP Percentile --      Girls Diastolic BP Percentile --      Boys Systolic BP Percentile --      Boys Diastolic BP Percentile --      Pulse Rate 09/08/24 1013 60     Resp 09/08/24 1013 20     Temp 09/08/24 1013 98.3 F (36.8 C)     Temp Source 09/08/24 1013 Oral     SpO2 09/08/24 1013 98 %     Weight --      Height --      Head Circumference --      Peak Flow --      Pain Score 09/08/24 1014 2     Pain Loc --      Pain Education --      Exclude from Growth Chart --    No data found.  Updated Vital Signs BP (!) 144/90   Pulse 60   Temp 98.3 F (36.8 C) (Oral)   Resp 20   LMP 08/10/2024 (Approximate)   SpO2 98%  Visual Acuity Right Eye Distance:   Left Eye Distance:   Bilateral Distance:    Right Eye Near:   Left Eye Near:    Bilateral Near:     Physical Exam Vitals and nursing note reviewed.  Constitutional:      General: She is not in acute distress.    Appearance: Normal appearance. She is not ill-appearing, toxic-appearing or diaphoretic.  Pulmonary:     Effort: Pulmonary effort is normal.  Neurological:     Mental Status: She is alert.  Psychiatric:        Mood and Affect: Mood normal.      UC Treatments / Results  Labs (all labs ordered are listed, but only abnormal results are displayed) Labs Reviewed  POCT URINE DIPSTICK - Abnormal; Notable for the following components:      Result Value   Color, UA orange (*)    Clarity, UA hazy (*)    Blood, UA trace-lysed (*)    Nitrite, UA Positive (*)    Leukocytes, UA Small (1+) (*)    All other components within normal limits  URINE CULTURE    EKG   Radiology No results found.  Procedures Procedures (including critical care time)  Medications  Ordered in UC Medications - No data to display  Initial Impression / Assessment and Plan / UC Course  I have reviewed the triage vital signs and the nursing notes.  Pertinent labs & imaging results that were available during my care of the patient were reviewed by me and considered in my medical decision making (see chart for details).     Dysuria- Treating for urinary tract infection.  Take the antibiotics as prescribed.  Will send the urine for culture and call with any changes if needed.  Make sure you are drinking plenty of fluids.  You can take Azo over-the-counter for your symptoms.  Follow-up as needed Final Clinical Impressions(s) / UC Diagnoses   Final diagnoses:  Dysuria     Discharge Instructions      Treating you for urinary tract infection.  Take the antibiotics as prescribed.  Will send the urine for culture and call with any changes if needed.  Make sure you are drinking plenty of fluids.  You can take Azo over-the-counter for your symptoms.  Follow-up as needed    ED Prescriptions     Medication Sig Dispense Auth. Provider   nitrofurantoin , macrocrystal-monohydrate, (MACROBID ) 100 MG capsule Take 1 capsule (100 mg total) by mouth 2 (two) times daily. 10 capsule Adah Wilbert LABOR, FNP      PDMP not reviewed this encounter.    [1]  Social History Tobacco Use   Smoking status: Never  Substance Use Topics   Alcohol use: Never   Drug use: Never     Adah Wilbert LABOR, FNP 09/08/24 1047  "

## 2024-09-09 LAB — URINE CULTURE: Culture: <10000 — AB

## 2024-09-10 ENCOUNTER — Ambulatory Visit (HOSPITAL_COMMUNITY): Payer: Self-pay
